# Patient Record
Sex: Female | Born: 2000 | Race: White | Hispanic: No | Marital: Single | Smoking: Current some day smoker
Health system: Southern US, Community
[De-identification: ages and names within clinical notes are randomized; demographics above are authoritative.]

## PROBLEM LIST (undated history)

## (undated) DIAGNOSIS — A599 Trichomoniasis, unspecified: Secondary | ICD-10-CM

## (undated) DIAGNOSIS — G43909 Migraine, unspecified, not intractable, without status migrainosus: Secondary | ICD-10-CM

## (undated) DIAGNOSIS — F329 Major depressive disorder, single episode, unspecified: Secondary | ICD-10-CM

## (undated) DIAGNOSIS — F32A Depression, unspecified: Secondary | ICD-10-CM

## (undated) HISTORY — DX: Trichomoniasis, unspecified: A59.9

## (undated) HISTORY — DX: Major depressive disorder, single episode, unspecified: F32.9

## (undated) HISTORY — PX: TONSILLECTOMY: SUR1361

## (undated) HISTORY — PX: COLONOSCOPY: SHX174

## (undated) HISTORY — DX: Migraine, unspecified, not intractable, without status migrainosus: G43.909

## (undated) HISTORY — DX: Depression, unspecified: F32.A

---
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
## 2020-01-05 ENCOUNTER — Ambulatory Visit (INDEPENDENT_AMBULATORY_CARE_PROVIDER_SITE_OTHER): Payer: Medicaid Other | Admitting: Family

## 2020-01-05 ENCOUNTER — Encounter: Payer: Self-pay | Admitting: Family

## 2020-01-05 ENCOUNTER — Other Ambulatory Visit: Payer: Self-pay

## 2020-01-05 VITALS — BP 110/60 | HR 72 | Temp 98.4°F | Resp 16

## 2020-01-05 DIAGNOSIS — R112 Nausea with vomiting, unspecified: Secondary | ICD-10-CM

## 2020-01-05 DIAGNOSIS — J309 Allergic rhinitis, unspecified: Secondary | ICD-10-CM

## 2020-01-05 DIAGNOSIS — R109 Unspecified abdominal pain: Secondary | ICD-10-CM

## 2020-01-05 DIAGNOSIS — K9049 Malabsorption due to intolerance, not elsewhere classified: Secondary | ICD-10-CM

## 2020-01-05 NOTE — Patient Instructions (Signed)
Allergic rhinitis (grass and dust mite) Continue avoidance measures May use Zyrtec 10 mg once a day as needed for runny nose or itching  Reducing Pollen Exposure The American Academy of Allergy, Asthma and Immunology suggests the following steps to reduce your exposure to pollen during allergy seasons. 1. Do not hang sheets or clothing out to dry; pollen may collect on these items. 2. Do not mow lawns or spend time around freshly cut grass; mowing stirs up pollen. 3. Keep windows closed at night.  Keep car windows closed while driving. 4. Minimize morning activities outdoors, a time when pollen counts are usually at their highest. 5. Stay indoors as much as possible when pollen counts or humidity is high and on windy days when pollen tends to remain in the air longer. 6. Use air conditioning when possible.  Many air conditioners have filters that trap the pollen spores. 7. Use a HEPA room air filter to remove pollen form the indoor air you breathe.  Control of Dust Mite Allergen Dust mites play a major role in allergic asthma and rhinitis. They occur in environments with high humidity wherever human skin is found. Dust mites absorb humidity from the atmosphere (ie, they do not drink) and feed on organic matter (including shed human and animal skin). Dust mites are a microscopic type of insect that you cannot see with the naked eye. High levels of dust mites have been detected from mattresses, pillows, carpets, upholstered furniture, bed covers, clothes, soft toys and any woven material. The principal allergen of the dust mite is found in its feces. A gram of dust may contain 1,000 mites and 250,000 fecal particles. Mite antigen is easily measured in the air during house cleaning activities. Dust mites do not bite and do not cause harm to humans, other than by triggering allergies/asthma.  Ways to decrease your exposure to dust mites in your home:  1. Encase mattresses, box springs and pillows with  a mite-impermeable barrier or cover  2. Wash sheets, blankets and drapes weekly in hot water (130 F) with detergent and dry them in a dryer on the hot setting.  3. Have the room cleaned frequently with a vacuum cleaner and a damp dust-mop. For carpeting or rugs, vacuuming with a vacuum cleaner equipped with a high-efficiency particulate air (HEPA) filter. The dust mite allergic individual should not be in a room which is being cleaned and should wait 1 hour after cleaning before going into the room.  4. Do not sleep on upholstered furniture (eg, couches).  5. If possible removing carpeting, upholstered furniture and drapery from the home is ideal. Horizontal blinds should be eliminated in the rooms where the person spends the most time (bedroom, study, television room). Washable vinyl, roller-type shades are optimal.  6. Remove all non-washable stuffed toys from the bedroom. Wash stuffed toys weekly like sheets and blankets above.  7. Reduce indoor humidity to less than 50%. Inexpensive humidity monitors can be purchased at most hardware stores. Do not use a humidifier as can make the problem worse and are not recommended.  Abdominal pain We will check a C4 and tryptase level. We will call you with results.  Food intolerance Continue to avoid foods that bother you.  Your skin testing to all foods today were negative.  Please let us know if this treatment plan is not working well for you Schedule follow up appointment as needed

## 2020-01-05 NOTE — Progress Notes (Signed)
[address] [place] [address] Dept: [phone]  FOLLOW UP NOTE  Patient ID: Patty Gomez, female    DOB: 05-Nov-2000  Age: 19 y.o. MRN: [id number] Date of Office Visit: 01/05/2020  Assessment  Chief Complaint: Allergy Testing  HPI Patty Gomez is a 19 year old female who presents today for skin testing to foods and environmental inhalants.  She was last seen on December 31, 2019 by Dr. Dellis Anes for possible food intolerance and chronic rhinitis.  She reports since her last office visit she has thrown up 2 times. When she does vomit it appears to be brown/green in color or clear to white in color. Her symptoms typically occur in the morning where she will have stomach cramps and nausea. One episode occurred 2 hours after eating plain toast and the other episode occurred without eating any food. She still feels like milk triggers her symptoms. She reports that both of these occurrences took place in the morning. She denies any concomminant rashes, difficulty breathing, difficulty swallowing, or shortness of breath.  She also denies any family history of angioedema or swelling.  She reports constipation, but has 3 bowel movements a day and denies diarrhea.  She denies any blood in her stools. She has a follow up appointment with GI in August or September.  She reports that her allergies are controlled with Zyrtec. She reports that her symptoms are worse in the spring and the summer. She reports rhinorrhea and itchy watery eyes.  Drug Allergies:  No Known Allergies  Review of Systems: Review of Systems  Constitutional: Negative for chills and fever.  HENT: Negative for nosebleeds and sore throat.   Eyes: Negative for blurred vision.  Respiratory: Negative for cough, shortness of breath and wheezing.   Cardiovascular: Negative for chest pain and palpitations.  Gastrointestinal: Positive for abdominal pain, constipation and vomiting. Negative for blood in stool and  diarrhea.  Genitourinary: Negative for dysuria.  Skin: Negative for itching and rash.    Physical Exam: BP 110/60 (BP Location: Right Arm, Patient Position: Sitting, Cuff Size: Normal)   Pulse 72   Temp 98.4 F (36.9 C) (Temporal)   Resp 16   SpO2 98%    Physical Exam Constitutional:      Appearance: Normal appearance.  HENT:     Head: Normocephalic and atraumatic.     Comments: Pharynx normal. Eyes normal. Nose normal. Ears normal    Right Ear: Tympanic membrane, ear canal and external ear normal.     Left Ear: Tympanic membrane, ear canal and external ear normal.     Nose: Nose normal.     Mouth/Throat:     Mouth: Mucous membranes are moist.     Pharynx: Oropharynx is clear.  Eyes:     Conjunctiva/sclera: Conjunctivae normal.  Cardiovascular:     Rate and Rhythm: Regular rhythm.     Heart sounds: Normal heart sounds.  Pulmonary:     Effort: Pulmonary effort is normal.     Breath sounds: Normal breath sounds.     Comments: Lungs clear to auscultation Skin:    General: Skin is warm.  Neurological:     Mental Status: She is alert and oriented to person, place, and time.  Psychiatric:        Mood and Affect: Mood normal.        Behavior: Behavior normal.        Thought Content: Thought content normal.        Judgment: Judgment normal.  Diagnostics:  Percutaneous skin testing today was positive today to dust mite and grass. All foods were negative despite a positive histaimine.  Assessment and Plan: 1. Food intolerance   2. Abdominal pain, unspecified abdominal location   3. Nausea and vomiting, intractability of vomiting not specified, unspecified vomiting type     No orders of the defined types were placed in this encounter.   Patient Instructions  Allergic rhinitis (grass and dust mite) Continue avoidance measures May use Zyrtec 10 mg once a day as needed for runny nose or itching  Reducing Pollen Exposure The American Academy of Allergy, Asthma and  Immunology suggests the following steps to reduce your exposure to pollen during allergy seasons. 1. Do not hang sheets or clothing out to dry; pollen may collect on these items. 2. Do not mow lawns or spend time around freshly cut grass; mowing stirs up pollen. 3. Keep windows closed at night.  Keep car windows closed while driving. 4. Minimize morning activities outdoors, a time when pollen counts are usually at their highest. 5. Stay indoors as much as possible when pollen counts or humidity is high and on windy days when pollen tends to remain in the air longer. 6. Use air conditioning when possible.  Many air conditioners have filters that trap the pollen spores. 7. Use a HEPA room air filter to remove pollen form the indoor air you breathe.  Control of Dust Mite Allergen Dust mites play a major role in allergic asthma and rhinitis. They occur in environments with high humidity wherever human skin is found. Dust mites absorb humidity from the atmosphere (ie, they do not drink) and feed on organic matter (including shed human and animal skin). Dust mites are a microscopic type of insect that you cannot see with the naked eye. High levels of dust mites have been detected from mattresses, pillows, carpets, upholstered furniture, bed covers, clothes, soft toys and any woven material. The principal allergen of the dust mite is found in its feces. A gram of dust may contain 1,000 mites and 250,000 fecal particles. Mite antigen is easily measured in the air during house cleaning activities. Dust mites do not bite and do not cause harm to humans, other than by triggering allergies/asthma.  Ways to decrease your exposure to dust mites in your home:  1. Encase mattresses, box springs and pillows with a mite-impermeable barrier or cover  2. Wash sheets, blankets and drapes weekly in hot water (130 F) with detergent and dry them in a dryer on the hot setting.  3. Have the room cleaned frequently with a  vacuum cleaner and a damp dust-mop. For carpeting or rugs, vacuuming with a vacuum cleaner equipped with a high-efficiency particulate air (HEPA) filter. The dust mite allergic individual should not be in a room which is being cleaned and should wait 1 hour after cleaning before going into the room.  4. Do not sleep on upholstered furniture (eg, couches).  5. If possible removing carpeting, upholstered furniture and drapery from the home is ideal. Horizontal blinds should be eliminated in the rooms where the person spends the most time (bedroom, study, television room). Washable vinyl, roller-type shades are optimal.  6. Remove all non-washable stuffed toys from the bedroom. Wash stuffed toys weekly like sheets and blankets above.  7. Reduce indoor humidity to less than 50%. Inexpensive humidity monitors can be purchased at most hardware stores. Do not use a humidifier as can make the problem worse and are not recommended.  Abdominal pain We will check a C4 and tryptase level. We will call you with results.  Food intolerance Continue to avoid foods that bother you.  Your skin testing to all foods today were negative.  Please let us know if this treatment plan is not working well for you Schedule follow up appointment as needed   Return if symptoms worsen or fail to improve.    Thank you for the opportunity to care for this patient.  Please do not hesitate to contact me with questions.  Nehemiah Settle, FNP [facility]

## 2020-03-03 ENCOUNTER — Other Ambulatory Visit: Payer: Self-pay

## 2020-08-08 ENCOUNTER — Other Ambulatory Visit: Payer: Medicaid Other

## 2020-08-09 IMAGING — NM NM GASTRIC EMPTYING
6 series · 6 of 6 positions shown · non-contrast
Comparison: None.

CLINICAL DATA: Nausea and vomiting

EXAM:
NUCLEAR MEDICINE GASTRIC EMPTYING SCAN
TECHNIQUE: After oral ingestion of radiolabeled meal, sequential abdominal
images were obtained for 120 minutes. Residual percentage of
activity remaining within the stomach was calculated at 60 and 120
minutes.
RADIOPHARMACEUTICALS:  1.85 mCi Yc-GGm sulfur colloid in
standardized meal including egg

[Series 1: 0 min · 4.14mm/px · 1 of 1 slices shown (1 of 2)]
[im 1/1]
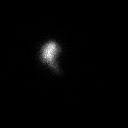

[Series 1: 0 min · 4.14mm/px · 1 of 1 slices shown (2 of 2)]
[im 1/1]
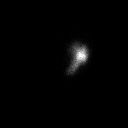

[Series 2: 60 min · 4.14mm/px · 1 of 1 slices shown (1 of 2)]
[im 1/1]
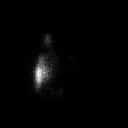

[Series 2: 60 min · 4.14mm/px · 1 of 1 slices shown (2 of 2)]
[im 1/1]
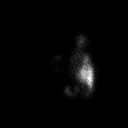

[Series 3: 120 min · 4.14mm/px · 1 of 1 slices shown (1 of 2)]
[im 1/1]
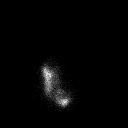

[Series 3: 120 min · 4.14mm/px · 1 of 1 slices shown (2 of 2)]
[im 1/1]
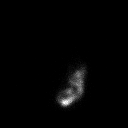

[6 of 6 positions shown; findings below may reference images not displayed]

FINDINGS: Expected location of the stomach in the left upper quadrant.
Ingested meal empties the stomach gradually over the course of the
study with 21.4% retention at 60 min and 3.4% retention at 120 min
(normal retention less than 30% at a 120 min).
IMPRESSION: Normal gastric emptying study.

## 2021-03-07 ENCOUNTER — Ambulatory Visit: Payer: Medicaid Other | Admitting: Adult Health

## 2021-03-20 ENCOUNTER — Other Ambulatory Visit (HOSPITAL_COMMUNITY)
Admission: RE | Admit: 2021-03-20 | Discharge: 2021-03-20 | Disposition: A | Discharge status: Home / Self Care | Payer: Medicaid Other | Source: Ambulatory Visit | Attending: Adult Health | Admitting: Adult Health

## 2021-03-20 ENCOUNTER — Encounter: Payer: Self-pay | Admitting: Adult Health

## 2021-03-20 ENCOUNTER — Ambulatory Visit (INDEPENDENT_AMBULATORY_CARE_PROVIDER_SITE_OTHER): Payer: Medicaid Other | Admitting: Adult Health

## 2021-03-20 ENCOUNTER — Other Ambulatory Visit: Payer: Self-pay

## 2021-03-20 VITALS — BP 124/81 | HR 98 | Ht 69.5 in | Wt 178.0 lb

## 2021-03-20 DIAGNOSIS — Z113 Encounter for screening for infections with a predominantly sexual mode of transmission: Secondary | ICD-10-CM | POA: Insufficient documentation

## 2021-03-20 DIAGNOSIS — N949 Unspecified condition associated with female genital organs and menstrual cycle: Secondary | ICD-10-CM | POA: Insufficient documentation

## 2021-03-20 DIAGNOSIS — N898 Other specified noninflammatory disorders of vagina: Secondary | ICD-10-CM | POA: Insufficient documentation

## 2021-03-20 DIAGNOSIS — Z975 Presence of (intrauterine) contraceptive device: Secondary | ICD-10-CM | POA: Diagnosis not present

## 2021-03-20 DIAGNOSIS — N9489 Other specified conditions associated with female genital organs and menstrual cycle: Secondary | ICD-10-CM

## 2021-03-20 DIAGNOSIS — R300 Dysuria: Secondary | ICD-10-CM | POA: Insufficient documentation

## 2021-03-20 HISTORY — DX: Encounter for screening for infections with a predominantly sexual mode of transmission: Z11.3

## 2021-03-20 HISTORY — DX: Unspecified condition associated with female genital organs and menstrual cycle: N94.9

## 2021-03-20 HISTORY — DX: Other specified noninflammatory disorders of vagina: N89.8

## 2021-03-20 HISTORY — DX: Other specified conditions associated with female genital organs and menstrual cycle: N94.89

## 2021-03-20 NOTE — Progress Notes (Signed)
  Subjective:     Patient ID: Patty Gomez, female   DOB: [birth year], 20 y.o.   MRN: [id number]  HPI Patty Gomez is a 20 year old white female,single, G0P0, in complaining of vaginal discharge and burning, it looks pink at times. PCP is [facility].   Review of Systems +Has vaginal discharge +Has vaginal burning  +burns when pees, but not UTI when checked No sex in 2 months Stomach hurts at times Had bump right labia but it is gone now Reviewed past medical,surgical, social and family history. Reviewed medications and allergies.     Objective:   Physical Exam BP 124/81 (BP Location: Left Arm, Patient Position: Sitting, Cuff Size: Normal)   Pulse 98   Ht 5' 9.5" (1.765 m)   Wt 178 lb (80.7 kg)   BMI 25.91 kg/m  Skin warm and dry.Pelvic: external genitalia is normal in appearance no lesions, vagina:tan/pinkish discharge without odor,urethra has no lesions or masses noted, cervix is non tender with +IUD strings at os, uterus: normal size, shape and contour, non tender, no masses felt, adnexa: no masses or tenderness noted. Bladder is non tender and no masses felt. CV swab obtained.  Upstream - 03/20/21 1410       Pregnancy Intention Screening   Does the patient want to become pregnant in the next year? No    Does the patient's partner want to become pregnant in the next year? No    Would the patient like to discuss contraceptive options today? No      Contraception Wrap Up   Current Method IUD or IUS    End Method IUD or IUS    Contraception Counseling Provided No            Examination chaperoned by Marchelle Folks LPN.     Assessment:     1. Vaginal discharge CV swab sent  - Cervicovaginal ancillary only( [place])  2. Vaginal burning  - Cervicovaginal ancillary only( [place])  3. IUD (intrauterine device) in place   4. Screening examination for STD (sexually transmitted disease) CV swab sent for GC/CHL,trich and BV,yeast - Cervicovaginal ancillary only( [facility])  5. Burning with urination Has not had UTI, when checked      Plan:     Pap and physical 10/29/21 with me

## 2021-03-22 ENCOUNTER — Other Ambulatory Visit: Payer: Self-pay | Admitting: Adult Health

## 2021-03-22 LAB — CERVICOVAGINAL ANCILLARY ONLY
Bacterial Vaginitis (gardnerella): NEGATIVE
Candida Glabrata: NEGATIVE
Candida Vaginitis: POSITIVE — AB
Chlamydia: NEGATIVE
Comment: NEGATIVE
Comment: NEGATIVE
Comment: NEGATIVE
Comment: NEGATIVE
Comment: NEGATIVE
Comment: NORMAL
Neisseria Gonorrhea: NEGATIVE
Trichomonas: NEGATIVE

## 2021-03-22 MED ORDER — FLUCONAZOLE 150 MG PO TABS: ORAL_TABLET | ORAL | 1 refills | Status: DC

## 2021-03-22 NOTE — Progress Notes (Signed)
+  yeast on vaginal swab will rx diflucan  

## 2021-05-13 DIAGNOSIS — Z114 Encounter for screening for human immunodeficiency virus [HIV]: Secondary | ICD-10-CM | POA: Diagnosis not present

## 2021-05-13 DIAGNOSIS — F909 Attention-deficit hyperactivity disorder, unspecified type: Secondary | ICD-10-CM | POA: Diagnosis not present

## 2021-05-13 DIAGNOSIS — N898 Other specified noninflammatory disorders of vagina: Secondary | ICD-10-CM | POA: Diagnosis not present

## 2021-05-13 DIAGNOSIS — Z7251 High risk heterosexual behavior: Secondary | ICD-10-CM | POA: Diagnosis not present

## 2021-05-13 DIAGNOSIS — Z20828 Contact with and (suspected) exposure to other viral communicable diseases: Secondary | ICD-10-CM | POA: Diagnosis not present

## 2021-05-23 ENCOUNTER — Encounter: Payer: Self-pay | Admitting: Emergency Medicine

## 2021-05-23 ENCOUNTER — Other Ambulatory Visit: Payer: Self-pay

## 2021-05-23 ENCOUNTER — Ambulatory Visit
Admission: EM | Admit: 2021-05-23 | Discharge: 2021-05-23 | Disposition: A | Discharge status: Home / Self Care | Payer: Medicaid Other | Attending: Family Medicine | Admitting: Family Medicine

## 2021-05-23 DIAGNOSIS — M79604 Pain in right leg: Secondary | ICD-10-CM

## 2021-05-23 DIAGNOSIS — R059 Cough, unspecified: Secondary | ICD-10-CM | POA: Diagnosis not present

## 2021-05-23 DIAGNOSIS — J069 Acute upper respiratory infection, unspecified: Secondary | ICD-10-CM

## 2021-05-23 MED ORDER — NAPROXEN 500 MG PO TABS: 500.0000 mg | ORAL_TABLET | Freq: Two times a day (BID) | ORAL | 0 refills | Status: DC | PRN

## 2021-05-23 MED ORDER — PROMETHAZINE-DM 6.25-15 MG/5ML PO SYRP: 5.0000 mL | ORAL_SOLUTION | Freq: Four times a day (QID) | ORAL | 0 refills | Status: DC | PRN

## 2021-05-23 MED ORDER — CYCLOBENZAPRINE HCL 5 MG PO TABS: 5.0000 mg | ORAL_TABLET | Freq: Three times a day (TID) | ORAL | 0 refills | Status: DC | PRN

## 2021-05-23 NOTE — ED Provider Notes (Signed)
[facility]    CSN: [id number] Arrival date & time: 05/23/21  1138      History   Chief Complaint Chief Complaint  Patient presents with   Leg Pain    HPI Patty Gomez is a 20 y.o. female.   Presenting today with 2-day history of fever, sweats, chills, weakness, cough, congestion, sore throat.  Denies chest pain, shortness of breath, abdominal pain, nausea vomiting or diarrhea.  States her fever has broken and she is feeling a whole lot better today but still needs a COVID and flu test.  Taking Tylenol with mild temporary relief of symptoms has not had anything today thus far.  She is also complaining of about 5 days of right posterior buttock pain going down to her right lateral leg above the knee.  She denies injury to the area, numbness, tingling, weakness, swelling, redness.  So far not tried anything over-the-counter for this.   Past Medical History:  Diagnosis Date   Depression    Migraines     Patient Active Problem List   Diagnosis Date Noted   IUD (intrauterine device) in place 03/20/2021   Vaginal burning 03/20/2021   Vaginal discharge 03/20/2021   Burning with urination 03/20/2021   Screening examination for STD (sexually transmitted disease) 03/20/2021   Abdominal pain    GERD (gastroesophageal reflux disease) 04/06/2019   Nausea without vomiting 03/03/2019   IBS (irritable bowel syndrome) 03/03/2019   Encounter for IUD insertion 01/27/2018   Anxiety state 08/22/2015   Tension headache 06/21/2015   Migraine without aura and without status migrainosus, not intractable 06/21/2015   Acute bronchitis 01/20/2013   Unspecified sinusitis (chronic) 01/20/2013    Past Surgical History:  Procedure Laterality Date   BIOPSY  05/07/2019   Procedure: BIOPSY;  Surgeon: West Bali, MD;  Location: [facility];  Service: Endoscopy;;   COLONOSCOPY     ESOPHAGOGASTRODUODENOSCOPY N/A 05/07/2019   Procedure: ESOPHAGOGASTRODUODENOSCOPY (EGD);  Surgeon:  West Bali, MD;  Location: [facility];  Service: Endoscopy;  Laterality: N/A;  3:00pm   TONSILLECTOMY      OB History     Gravida  0   Para  0   Term  0   Preterm  0   AB  0   Living  0      SAB  0   IAB  0   Ectopic  0   Multiple  0   Live Births  0            Home Medications    Prior to Admission medications   Medication Sig Start Date End Date Taking? Authorizing Provider  cyclobenzaprine (FLEXERIL) 5 MG tablet Take 1 tablet (5 mg total) by mouth 3 (three) times daily as needed for muscle spasms. Do not drink alcohol or drive while taking this medication.  May cause drowsiness. 05/23/21  Yes Particia Nearing, PA-C  naproxen (NAPROSYN) 500 MG tablet Take 1 tablet (500 mg total) by mouth 2 (two) times daily as needed. 05/23/21  Yes Particia Nearing, PA-C  promethazine-dextromethorphan (PROMETHAZINE-DM) 6.25-15 MG/5ML syrup Take 5 mLs by mouth 4 (four) times daily as needed. 05/23/21  Yes Particia Nearing, PA-C  amitriptyline (ELAVIL) 10 MG tablet Take 10 mg by mouth at bedtime. 09/16/19   [provider]  dicyclomine (BENTYL) 20 MG tablet Take 20 mg by mouth 3 (three) times daily. 10/11/19   [provider]  fluconazole (DIFLUCAN) 150 MG tablet Take 1 now  and 1 in 3 days 03/22/21   Cyril Mourning A, NP  ibuprofen (ADVIL) 200 MG tablet Take 200 mg by mouth daily as needed for mild pain, moderate pain or cramping.     [provider]  levonorgestrel (LILETTA) 19.5 MCG/DAY IUD IUD 1 each by Intrauterine route once.    [provider]  lisdexamfetamine (VYVANSE) 20 MG capsule Take 20 mg by mouth daily.    [provider]  loratadine (CLARITIN) 10 MG tablet Take 10 mg by mouth daily. Patient not taking: Reported on 03/20/2021 12/07/19   [provider]  omeprazole (PRILOSEC) 40 MG capsule Take 40 mg by mouth daily. Patient not taking: Reported on 03/20/2021 07/22/19   [provider]   ondansetron (ZOFRAN) 4 MG tablet Take 1 tablet (4 mg total) by mouth every 6 (six) hours. Patient taking differently: Take 4 mg by mouth every 8 (eight) hours as needed for nausea or vomiting. 07/15/19   Wynetta Fines, MD  pantoprazole (PROTONIX) 40 MG tablet Take 1 tablet (40 mg total) by mouth 2 (two) times daily before a meal. 04/09/19   Gelene Mink, NP  promethazine (PHENERGAN) 25 MG tablet Take 1 tablet (25 mg total) by mouth every 6 (six) hours as needed for nausea or vomiting. Patient not taking: Reported on 04/09/2019 04/07/19 10/25/19  Gelene Mink, NP    Family History Family History  Problem Relation Age of Onset   Bipolar disorder Mother    Cancer Mother    Hypertension Paternal Grandmother    Diabetes Paternal Grandmother    Diabetes Other    Cancer Other    Colon cancer Neg Hx    Colon polyps Neg Hx     Social History Social History   Tobacco Use   Smoking status: Never   Smokeless tobacco: Never  Vaping Use   Vaping Use: Former  Substance Use Topics   Alcohol use: No   Drug use: No     Allergies   Patient has no known allergies.   Review of Systems Review of Systems Per HPI  Physical Exam Triage Vital Signs ED Triage Vitals  Enc Vitals Group     BP 05/23/21 1259 124/66     Pulse Rate 05/23/21 1259 99     Resp 05/23/21 1259 18     Temp 05/23/21 1259 98.3 F (36.8 C)     Temp Source 05/23/21 1259 Oral     SpO2 05/23/21 1259 98 %     Weight 05/23/21 1243 198 lb (89.8 kg)     Height 05/23/21 1243 5' 9.5" (1.765 m)     Head Circumference --      Peak Flow --      Pain Score 05/23/21 1243 6     Pain Loc --      Pain Edu? --      Excl. in GC? --    No data found.  Updated Vital Signs BP 124/66 (BP Location: Right Arm)   Pulse 99   Temp 98.3 F (36.8 C) (Oral)   Resp 18   Ht 5' 9.5" (1.765 m)   Wt 198 lb (89.8 kg)   SpO2 98%   BMI 28.82 kg/m   Visual Acuity Right Eye Distance:   Left Eye Distance:   Bilateral Distance:     Right Eye Near:   Left Eye Near:    Bilateral Near:     Physical Exam Vitals and nursing note reviewed.  Constitutional:  Appearance: Normal appearance. She is not ill-appearing.  HENT:     Head: Atraumatic.     Right Ear: Tympanic membrane normal.     Left Ear: Tympanic membrane normal.     Nose: Rhinorrhea present.     Mouth/Throat:     Mouth: Mucous membranes are moist.     Pharynx: Posterior oropharyngeal erythema present.  Eyes:     Extraocular Movements: Extraocular movements intact.     Conjunctiva/sclera: Conjunctivae normal.  Cardiovascular:     Rate and Rhythm: Normal rate and regular rhythm.     Heart sounds: Normal heart sounds.  Pulmonary:     Effort: Pulmonary effort is normal.     Breath sounds: Normal breath sounds. No wheezing or rales.  Musculoskeletal:        General: Tenderness present. No swelling or deformity. Normal range of motion.     Cervical back: Normal range of motion and neck supple.     Comments: Tender to palpation right posterior buttock and right lateral upper leg.  Normal gait and range of motion, strength  Skin:    General: Skin is warm and dry.     Findings: No bruising or erythema.  Neurological:     Mental Status: She is alert and oriented to person, place, and time.     Sensory: No sensory deficit.     Motor: No weakness.     Gait: Gait normal.  Psychiatric:        Mood and Affect: Mood normal.        Thought Content: Thought content normal.        Judgment: Judgment normal.   UC Treatments / Results  Labs (all labs ordered are listed, but only abnormal results are displayed) Labs Reviewed  COVID-19, FLU A+B NAA    EKG   Radiology No results found.  Procedures Procedures (including critical care time)  Medications Ordered in UC Medications - No data to display  Initial Impression / Assessment and Plan / UC Course  I have reviewed the triage vital signs and the nursing notes.  Pertinent labs & imaging  results that were available during my care of the patient were reviewed by me and considered in my medical decision making (see chart for details).     Right leg pain consistent with muscular strain, will treat with naproxen, Flexeril, heat, stretches, rest.  She does appear to have a viral upper respiratory infection, COVID and flu testing pending, treat with Phenergan DM, over-the-counter cold and congestion medications, supportive home care.  Return for acutely worsening symptoms.  Work note given.  Return for acutely worsening symptoms.  Final Clinical Impressions(s) / UC Diagnoses   Final diagnoses:  Cough, unspecified type  Viral URI with cough  Right leg pain   Discharge Instructions   None    ED Prescriptions     Medication Sig Dispense Auth. Provider   naproxen (NAPROSYN) 500 MG tablet Take 1 tablet (500 mg total) by mouth 2 (two) times daily as needed. 30 tablet Particia Nearing, [place]   cyclobenzaprine (FLEXERIL) 5 MG tablet Take 1 tablet (5 mg total) by mouth 3 (three) times daily as needed for muscle spasms. Do not drink alcohol or drive while taking this medication.  May cause drowsiness. 15 tablet Particia Nearing, [place]   promethazine-dextromethorphan (PROMETHAZINE-DM) 6.25-15 MG/5ML syrup Take 5 mLs by mouth 4 (four) times daily as needed. 100 mL Particia Nearing, [place]      PDMP not reviewed this  encounter.   Particia Nearing, [place] 05/23/21 1343

## 2021-05-23 NOTE — ED Triage Notes (Signed)
Weakness, fever, night sweats x2 days. Covid negative at home. Pt reports feels better but wanted to get checked for covid/flu.  RLE pain/thigh x5 days. Pt denies any known injury. Pt reports increased pain with ambulation.

## 2021-05-24 LAB — COVID-19, FLU A+B NAA
Influenza A, NAA: NOT DETECTED
Influenza B, NAA: NOT DETECTED
SARS-CoV-2, NAA: NOT DETECTED

## 2021-06-04 ENCOUNTER — Other Ambulatory Visit: Payer: Self-pay

## 2021-06-04 ENCOUNTER — Ambulatory Visit (INDEPENDENT_AMBULATORY_CARE_PROVIDER_SITE_OTHER): Payer: Medicaid Other | Admitting: Adult Health

## 2021-06-04 ENCOUNTER — Encounter: Payer: Self-pay | Admitting: Adult Health

## 2021-06-04 ENCOUNTER — Other Ambulatory Visit (HOSPITAL_COMMUNITY)
Admission: RE | Admit: 2021-06-04 | Discharge: 2021-06-04 | Disposition: A | Discharge status: Home / Self Care | Payer: Medicaid Other | Source: Ambulatory Visit | Attending: Adult Health | Admitting: Adult Health

## 2021-06-04 VITALS — BP 125/80 | HR 109 | Ht 69.5 in | Wt 183.5 lb

## 2021-06-04 DIAGNOSIS — Z975 Presence of (intrauterine) contraceptive device: Secondary | ICD-10-CM

## 2021-06-04 DIAGNOSIS — N898 Other specified noninflammatory disorders of vagina: Secondary | ICD-10-CM | POA: Insufficient documentation

## 2021-06-04 DIAGNOSIS — Z113 Encounter for screening for infections with a predominantly sexual mode of transmission: Secondary | ICD-10-CM | POA: Diagnosis not present

## 2021-06-04 DIAGNOSIS — A599 Trichomoniasis, unspecified: Secondary | ICD-10-CM | POA: Insufficient documentation

## 2021-06-04 HISTORY — DX: Other specified noninflammatory disorders of vagina: N89.8

## 2021-06-04 LAB — POCT WET PREP (WET MOUNT)
Clue Cells Wet Prep Whiff POC: POSITIVE
WBC, Wet Prep HPF POC: POSITIVE

## 2021-06-04 MED ORDER — METRONIDAZOLE 500 MG PO TABS: 500.0000 mg | ORAL_TABLET | Freq: Two times a day (BID) | ORAL | 0 refills | Status: DC

## 2021-06-04 NOTE — Progress Notes (Signed)
°  Subjective:     Patient ID: Patty Gomez, female   DOB: 10-28-2000, 20 y.o.   MRN: [id number]  HPI Patty Gomez is a 20 year old white female,single, G0P0, in for STD screening, feels like trich still there, did not finish meds, she got sick ?flu. PCP is [facility].  Review of Systems +vaginal discharge and feels irritated Has not had sex lately Reviewed past medical,surgical, social and family history. Reviewed medications and allergies.     Objective:   Physical Exam BP 125/80 (BP Location: Left Arm, Patient Position: Sitting, Cuff Size: Normal)    Pulse (!) 109    Ht 5' 9.5" (1.765 m)    Wt 183 lb 8 oz (83.2 kg)    BMI 26.71 kg/m     Skin warm and dry.Pelvic: external genitalia is normal in appearance no lesions, vagina: white,frothy discharge with odor,urethra has no lesions or masses noted, cervix +IUD strings at os, CV swab obtained, uterus: normal size, shape and contour, non tender, no masses felt, adnexa: no masses or tenderness noted. Bladder is non tender and no masses felt. Wet prep: + for trich and +WBCs. Fall risk is low    Upstream - 06/04/21 1008       Pregnancy Intention Screening   Does the patient want to become pregnant in the next year? No    Does the patient's partner want to become pregnant in the next year? No    Would the patient like to discuss contraceptive options today? No      Contraception Wrap Up   Current Method IUD or IUS    End Method IUD or IUS    Contraception Counseling Provided No            Examination chaperoned by Malachy Mood LPN  Assessment:     1. Vaginal discharge  2. Trichomoniasis No sex or alcohol Meds ordered this encounter  Medications   metroNIDAZOLE (FLAGYL) 500 MG tablet    Sig: Take 1 tablet (500 mg total) by mouth 2 (two) times daily.    Dispense:  14 tablet    Refill:  0    Order Specific Question:   Supervising Provider    Answer:   Despina Hidden, LUTHER H [[id number]]     3. Screening examination for STD (sexually  transmitted disease) CV swab sent for GC/CHL.trich and BV, yeast  She declines HIV,RPR  4. IUD (intrauterine device) in place  5. Vaginal irritation     Plan:     Follow up in 2 weeks for proof of treatment

## 2021-06-05 ENCOUNTER — Other Ambulatory Visit: Payer: Self-pay | Admitting: Adult Health

## 2021-06-05 LAB — CERVICOVAGINAL ANCILLARY ONLY
Bacterial Vaginitis (gardnerella): POSITIVE — AB
Candida Glabrata: POSITIVE — AB
Candida Vaginitis: NEGATIVE
Chlamydia: NEGATIVE
Comment: NEGATIVE
Comment: NEGATIVE
Comment: NEGATIVE
Comment: NEGATIVE
Comment: NEGATIVE
Comment: NORMAL
Neisseria Gonorrhea: NEGATIVE
Trichomonas: POSITIVE — AB

## 2021-06-05 MED ORDER — FLUCONAZOLE 150 MG PO TABS: ORAL_TABLET | ORAL | 1 refills | Status: DC

## 2021-06-05 NOTE — Progress Notes (Signed)
+  yeast on vaginal swab, will rx diflucan, already treated for trich and BV

## 2021-06-19 ENCOUNTER — Ambulatory Visit: Payer: Self-pay | Admitting: Adult Health

## 2021-09-25 ENCOUNTER — Other Ambulatory Visit (INDEPENDENT_AMBULATORY_CARE_PROVIDER_SITE_OTHER): Payer: Medicaid Other | Admitting: *Deleted

## 2021-09-25 ENCOUNTER — Other Ambulatory Visit (HOSPITAL_COMMUNITY)
Admission: RE | Admit: 2021-09-25 | Discharge: 2021-09-25 | Disposition: A | Discharge status: Home / Self Care | Payer: Medicaid Other | Source: Ambulatory Visit | Attending: Obstetrics & Gynecology | Admitting: Obstetrics & Gynecology

## 2021-09-25 DIAGNOSIS — N898 Other specified noninflammatory disorders of vagina: Secondary | ICD-10-CM | POA: Diagnosis not present

## 2021-09-25 DIAGNOSIS — Z113 Encounter for screening for infections with a predominantly sexual mode of transmission: Secondary | ICD-10-CM | POA: Diagnosis present

## 2021-09-25 NOTE — Progress Notes (Signed)
? ?  NURSE VISIT- VAGINITIS/STD ? ?SUBJECTIVE:  ?Patty Gomez is a 21 y.o. [facility] patient female here for a vaginal swab for vaginitis screening, STD screen.  She reports the following symptoms:  vaginal odor and discharge  for 1-3 weeks. ?Denies abnormal vaginal bleeding, significant pelvic pain, fever, or UTI symptoms. Pt is wanting to get pregnant so she will schedule an appt for IUD removal.  ? ?OBJECTIVE:  ?There were no vitals taken for this visit.  ?Appears well, in no apparent distress ? ?ASSESSMENT: ?Vaginal swab for vaginitis screening & STD screening ? ?PLAN: ?Self-collected vaginal probe for Gonorrhea, Chlamydia, Trichomonas, Bacterial Vaginosis, Yeast sent to lab ?Treatment: to be determined once results are received ?Follow-up as needed if symptoms persist/worsen, or new symptoms develop ? ?Levy Pupa  ?09/25/2021 ?11:01 AM ? ?

## 2021-09-26 ENCOUNTER — Other Ambulatory Visit: Payer: Self-pay | Admitting: Women's Health

## 2021-09-26 LAB — CERVICOVAGINAL ANCILLARY ONLY
Bacterial Vaginitis (gardnerella): POSITIVE — AB
Candida Glabrata: NEGATIVE
Candida Vaginitis: NEGATIVE
Chlamydia: NEGATIVE
Comment: NEGATIVE
Comment: NEGATIVE
Comment: NEGATIVE
Comment: NEGATIVE
Comment: NEGATIVE
Comment: NORMAL
Neisseria Gonorrhea: NEGATIVE
Trichomonas: NEGATIVE

## 2021-09-26 MED ORDER — METRONIDAZOLE 500 MG PO TABS: 500.0000 mg | ORAL_TABLET | Freq: Two times a day (BID) | ORAL | 0 refills | Status: DC

## 2021-09-28 ENCOUNTER — Telehealth: Payer: Self-pay | Admitting: *Deleted

## 2021-09-28 NOTE — Telephone Encounter (Signed)
Pt aware swab was + for BV. Advised to take med as directed and don't have sex or drink alcohol while on med. Pt voiced understanding. JSY ?

## 2021-09-28 NOTE — Telephone Encounter (Signed)
-----   Message from Roma Schanz, [place] sent at 09/28/2021  8:47 AM EDT ----- ?Hasn't read [employer]. Please call and read it to her/have her read it. Thanks  ?

## 2021-10-10 DIAGNOSIS — F988 Other specified behavioral and emotional disorders with onset usually occurring in childhood and adolescence: Secondary | ICD-10-CM | POA: Diagnosis not present

## 2021-10-10 DIAGNOSIS — Z79899 Other long term (current) drug therapy: Secondary | ICD-10-CM | POA: Diagnosis not present

## 2021-10-17 ENCOUNTER — Ambulatory Visit: Payer: Medicaid Other | Admitting: Obstetrics & Gynecology

## 2021-10-23 ENCOUNTER — Encounter: Payer: Self-pay | Admitting: Adult Health

## 2021-10-29 ENCOUNTER — Other Ambulatory Visit: Payer: Medicaid Other | Admitting: Adult Health

## 2021-10-31 DIAGNOSIS — K648 Other hemorrhoids: Secondary | ICD-10-CM | POA: Diagnosis not present

## 2021-10-31 DIAGNOSIS — R1115 Cyclical vomiting syndrome unrelated to migraine: Secondary | ICD-10-CM | POA: Diagnosis not present

## 2022-01-09 ENCOUNTER — Other Ambulatory Visit (HOSPITAL_COMMUNITY)
Admission: RE | Admit: 2022-01-09 | Discharge: 2022-01-09 | Disposition: A | Discharge status: Home / Self Care | Payer: Medicaid Other | Source: Ambulatory Visit | Attending: Obstetrics & Gynecology | Admitting: Obstetrics & Gynecology

## 2022-01-09 ENCOUNTER — Encounter: Payer: Self-pay | Admitting: Obstetrics & Gynecology

## 2022-01-09 ENCOUNTER — Ambulatory Visit (INDEPENDENT_AMBULATORY_CARE_PROVIDER_SITE_OTHER): Payer: Medicaid Other | Admitting: Obstetrics & Gynecology

## 2022-01-09 VITALS — BP 116/59 | HR 96 | Ht 69.2 in | Wt 180.0 lb

## 2022-01-09 DIAGNOSIS — Z113 Encounter for screening for infections with a predominantly sexual mode of transmission: Secondary | ICD-10-CM | POA: Diagnosis not present

## 2022-01-09 DIAGNOSIS — N898 Other specified noninflammatory disorders of vagina: Secondary | ICD-10-CM

## 2022-01-09 DIAGNOSIS — N3010 Interstitial cystitis (chronic) without hematuria: Secondary | ICD-10-CM

## 2022-01-09 DIAGNOSIS — Z124 Encounter for screening for malignant neoplasm of cervix: Secondary | ICD-10-CM | POA: Diagnosis not present

## 2022-01-09 NOTE — Progress Notes (Signed)
   GYN VISIT Patient name: Patty Gomez MRN [id number]  Date of birth: 2001/01/22 Chief Complaint:   irregular bleeding (For 1 month. Pain with sex)  History of Present Illness:   Patty Gomez is a 21 y.o. G0 female being seen today for the following concerns:  -Notes vaginal odor and irregular bleeding and dyspareunia for the past 4 mos.  Most days has a brown discharge.  No period. Same partner x 23yrs  Notes urinary discomfort mostly right before she voids.  Feels like this has been going on for a few years.  Prior UT work up completed and always negative.  Mirena placed August 2019- for quite some time did not have a menses, now with this discharge/bleeding  No LMP recorded. (Menstrual status: IUD).      No data to display           Review of Systems:   Pertinent items are noted in HPI Denies fever/chills, dizziness, headaches, visual disturbances, fatigue, shortness of breath, chest pain, abdominal pain, vomiting. Pertinent History Reviewed:  Reviewed past medical,surgical, social, obstetrical and family history.  Reviewed problem list, medications and allergies. Physical Assessment:   Vitals:   01/09/22 1429  BP: (!) 116/59  Pulse: 96  Weight: 180 lb (81.6 kg)  Height: 5' 9.2" (1.758 m)  Body mass index is 26.43 kg/m.       Physical Examination:   General appearance: alert, well appearing, and in no distress  Psych: mood appropriate, normal affect  Skin: warm & dry   Cardiovascular: normal heart rate noted  Respiratory: normal respiratory effort, no distress  Abdomen: soft, non-tender, no rebound, no guarding, no reproducible pain  Pelvic: VULVA: normal appearing vulva with no masses, tenderness or lesions, VAGINA: normal appearing vagina with normal color and discharge, no lesions, CERVIX: normal appearing cervix without discharge or lesions, strings visualized at os Extremities: no edema   Chaperone: Latisha Cresenzo    Assessment & Plan:  1) Vaginal  discharge/AUB Plan to r/o underlying infection Further management pending results  2) IUD in place -device appears in proper location -if infection negative and irregular bleeding/discharge continues may consider low dose OCP  3) Painful bladder syndrome/IC -PUF Questionnaire completed >20 -reviewed findings and discussed diagnosis -upon further questioning pt notes lots of citrus, spicy and caffeine -given handout regarding least/most bothersome food and advised review on her own   Return in about 1 year (around 01/10/2023) for Annual.   Myna Hidalgo, DO Attending Obstetrician & Gynecologist, [facility] [employer], [facility]

## 2022-01-14 LAB — CYTOLOGY - PAP
Adequacy: ABSENT
Chlamydia: NEGATIVE
Comment: NEGATIVE
Comment: NORMAL
Diagnosis: NEGATIVE
Neisseria Gonorrhea: NEGATIVE

## 2022-02-08 ENCOUNTER — Ambulatory Visit: Payer: Medicaid Other | Admitting: Internal Medicine

## 2022-02-08 ENCOUNTER — Encounter: Payer: Self-pay | Admitting: Internal Medicine

## 2022-02-08 VITALS — BP 121/84 | HR 100 | Ht 69.5 in | Wt 182.8 lb

## 2022-02-08 DIAGNOSIS — Z0000 Encounter for general adult medical examination without abnormal findings: Secondary | ICD-10-CM | POA: Diagnosis not present

## 2022-02-08 DIAGNOSIS — N939 Abnormal uterine and vaginal bleeding, unspecified: Secondary | ICD-10-CM | POA: Diagnosis not present

## 2022-02-08 DIAGNOSIS — K625 Hemorrhage of anus and rectum: Secondary | ICD-10-CM | POA: Diagnosis not present

## 2022-02-08 DIAGNOSIS — M79675 Pain in left toe(s): Secondary | ICD-10-CM

## 2022-02-08 DIAGNOSIS — Z23 Encounter for immunization: Secondary | ICD-10-CM

## 2022-02-08 DIAGNOSIS — M7989 Other specified soft tissue disorders: Secondary | ICD-10-CM | POA: Diagnosis not present

## 2022-02-08 DIAGNOSIS — N898 Other specified noninflammatory disorders of vagina: Secondary | ICD-10-CM

## 2022-02-08 DIAGNOSIS — F909 Attention-deficit hyperactivity disorder, unspecified type: Secondary | ICD-10-CM

## 2022-02-08 DIAGNOSIS — Z8719 Personal history of other diseases of the digestive system: Secondary | ICD-10-CM

## 2022-02-08 LAB — POCT URINE PREGNANCY: Preg Test, Ur: NEGATIVE

## 2022-02-08 NOTE — Patient Instructions (Signed)
It was a pleasure to see you today.  Thank you for giving [place] the opportunity to be involved in your care.  Below is a brief recap of your visit and next steps.  We will plan to see you again in 3 months  Summary We are checking basic labs today.  I have referred you to psychiatry to discuss ADHD medications Please follow up with your GI doctor about your rectal bleeding You have follow up scheduled with your OB/GYN 9/14  Next steps We will let you know about lab results next week

## 2022-02-08 NOTE — Progress Notes (Unsigned)
New Patient Office Visit  Subjective    Patient ID: Patty Gomez, female    DOB: [birth year]  Age: 21 y.o. MRN: [id number]  CC:  Chief Complaint  Patient presents with   Establish Care    HPI Patty Gomez presents to establish care.  She is a 21 year old woman with past medical history significant for migraines, GERD, IBS, and ADHD.  She presents today to establish care.  Her acute concerns today are rectal bleeding, which she endorses a recurrent history of.  She reports previously being evaluated by gastroenterology, with whom she is still in contact and states that she has undergone multiple procedures to "untwist" her intestines.  She also reports ongoing abnormal uterine bleeding and vaginal odor.  She is established with [facility] and was seen for this last month.  She is planning to make another appointment for them to further discuss the symptoms.  Lastly, [name] reports recently injuring the second digit of her left foot.  She endorses pain, discoloration, and swelling.  Acute concerns and chronic medical issues discussed today individually addressed in A/P below.  Outpatient Encounter Medications as of 02/08/2022  Medication Sig   amitriptyline (ELAVIL) 50 MG tablet Take 50 mg by mouth at bedtime.   levonorgestrel (LILETTA) 19.5 MCG/DAY IUD IUD 1 each by Intrauterine route once.   [DISCONTINUED] lisdexamfetamine (VYVANSE) 20 MG capsule Take 70 mg by mouth daily.   [DISCONTINUED] Acetaminophen (TYLENOL PO) Take by mouth. (Patient not taking: Reported on 01/09/2022)   [DISCONTINUED] cyclobenzaprine (FLEXERIL) 5 MG tablet Take 1 tablet (5 mg total) by mouth 3 (three) times daily as needed for muscle spasms. Do not drink alcohol or drive while taking this medication.  May cause drowsiness. (Patient not taking: Reported on 01/09/2022)   [DISCONTINUED] ibuprofen (ADVIL) 200 MG tablet Take 400 mg by mouth as needed. (Patient not taking: Reported on 01/09/2022)   [DISCONTINUED]  metroNIDAZOLE (FLAGYL) 500 MG tablet Take 1 tablet (500 mg total) by mouth 2 (two) times daily. (Patient not taking: Reported on 01/09/2022)   [DISCONTINUED] ondansetron (ZOFRAN) 4 MG tablet Take 1 tablet (4 mg total) by mouth every 6 (six) hours. (Patient not taking: Reported on 01/09/2022)   [DISCONTINUED] promethazine (PHENERGAN) 25 MG tablet Take 1 tablet (25 mg total) by mouth every 6 (six) hours as needed for nausea or vomiting. (Patient not taking: Reported on 04/09/2019)   No facility-administered encounter medications on file as of 02/08/2022.    Past Medical History:  Diagnosis Date   Depression    Migraines    Trichimoniasis     Past Surgical History:  Procedure Laterality Date   BIOPSY  05/07/2019   Procedure: BIOPSY;  Surgeon: Danie Binder, MD;  Location: [facility];  Service: Endoscopy;;   COLONOSCOPY     ESOPHAGOGASTRODUODENOSCOPY N/A 05/07/2019   Procedure: ESOPHAGOGASTRODUODENOSCOPY (EGD);  Surgeon: Danie Binder, MD;  Location: [facility];  Service: Endoscopy;  Laterality: N/A;  3:00pm   TONSILLECTOMY      Family History  Problem Relation Age of Onset   Bipolar disorder Mother    Cancer Mother    Hypertension Paternal Grandmother    Diabetes Paternal Grandmother    Diabetes Other    Cancer Other    Colon cancer Neg Hx    Colon polyps Neg Hx     Social History   Socioeconomic History   Marital status: Single    Spouse name: Not on file   Number of children: Not on  file   Years of education: Not on file   Highest education level: Not on file  Occupational History   Not on file  Tobacco Use   Smoking status: Never   Smokeless tobacco: Never  Vaping Use   Vaping Use: Former  Substance and Sexual Activity   Alcohol use: Yes    Comment: occ   Drug use: No   Sexual activity: Not Currently    Birth control/protection: I.U.D.  Other Topics Concern   Not on file  Social History Narrative   ** Merged History Encounter **       Lives with  her grandmother. [facility] in spring 2021.    Social Determinants of Health   Financial Resource Strain: Not on file  Food Insecurity: Not on file  Transportation Needs: Not on file  Physical Activity: Not on file  Stress: Not on file  Social Connections: Not on file  Intimate Partner Violence: Not on file    Review of Systems  Constitutional:  Negative for chills and fever.  HENT:  Negative for sore throat.   Respiratory:  Negative for cough and shortness of breath.   Cardiovascular:  Negative for chest pain, palpitations and leg swelling.  Gastrointestinal:  Positive for blood in stool. Negative for abdominal pain, constipation, diarrhea, nausea and vomiting.  Genitourinary:  Negative for dysuria and hematuria.       Vaginal bleeding, abnormal odor  Musculoskeletal:  Negative for myalgias.       Pain in toe on left foot  Skin:  Negative for itching and rash.  Neurological:  Negative for dizziness and headaches.  Psychiatric/Behavioral:  Negative for depression and suicidal ideas.     Objective    BP 121/84   Pulse 100   Ht 5' 9.5" (1.765 m)   Wt 182 lb 12.8 oz (82.9 kg)   SpO2 98%   BMI 26.61 kg/m   Physical Exam Vitals reviewed.  Constitutional:      General: She is not in acute distress.    Appearance: Normal appearance. She is normal weight. She is not toxic-appearing.  HENT:     Head: Normocephalic and atraumatic.     Nose: Nose normal. No congestion or rhinorrhea.     Mouth/Throat:     Mouth: Mucous membranes are moist.     Pharynx: Oropharynx is clear. No oropharyngeal exudate or posterior oropharyngeal erythema.  Eyes:     General: No scleral icterus.    Extraocular Movements: Extraocular movements intact.     Conjunctiva/sclera: Conjunctivae normal.     Pupils: Pupils are equal, round, and reactive to light.  Cardiovascular:     Rate and Rhythm: Normal rate and regular rhythm.  Pulmonary:     Effort: Pulmonary effort is normal.     Breath sounds: Normal  breath sounds. No wheezing, rhonchi or rales.  Abdominal:     General: Bowel sounds are normal. There is no distension.     Palpations: Abdomen is soft.  Musculoskeletal:        General: Tenderness present.     Comments: There is tenderness palpation, discoloration, mild swelling, and limited ROM at the DIP of the second digit of left foot  Lymphadenopathy:     Cervical: No cervical adenopathy.  Skin:    Capillary Refill: Capillary refill takes less than 2 seconds.  Neurological:     General: No focal deficit present.     Mental Status: She is alert and oriented to person, place, and time.  Cranial Nerves: No cranial nerve deficit.     Motor: No weakness.  Psychiatric:        Mood and Affect: Mood normal.        Behavior: Behavior normal.        Thought Content: Thought content normal.    Assessment & Plan:   Problem List Items Addressed This Visit       Other   Vaginal discharge    Today she endorses abnormal uterine bleeding and vaginal odor.  She was evaluated by [facility] last month for the symptoms.  There has been no improvement.  She has follow-up scheduled for next month.  Urine pregnancy test negative today.  Checking CBC and iron studies as well.      History of intussusception    Today she reports rectal bleeding.  She states states she has experienced the symptoms in the past and has been told that there was an issue with her intestines.  She reports previously undergoing endoscopic procedures to "untwist" her intestines.  On chart review, I am able to see where she has a history of intussusception.  She is planning to contact her gastroenterologist for a follow-up appointment and further evaluation.  Checking CBC and iron studies today.      ADHD - Primary    She endorses a history of ADHD, most recently prescribed Vyvanse.  However, she does not feel as though this medication is working any longer and she wants to stop the medication.  She is interested in  starting new medication, however she would like to speak with a psychiatrist about what is most appropriate for her.  I have placed a referral today.      Relevant Orders   Ambulatory referral to Psychiatry   Pain and swelling of toe of left foot    She reports recently injuring the second digit of left foot.  On exam today there is pain, swelling, discoloration, and limited ROM at the DIP of the second digit of left foot.  I have ordered x-rays today.      Relevant Orders   DG Foot Complete Left   Preventative health care    -Baseline labs ordered today, including one-time screenings for HIV/HCV -Influenza vaccine administered today -Additional HM items up-to-date       Relevant Orders   CBC with Differential (Completed)   CMP14+EGFR (Completed)   Fe+TIBC+Fer (Completed)   HIV antibody (with reflex) (Completed)   HCV Ab w Reflex to Quant PCR (Completed)   POCT urine pregnancy (Completed)   Flu Vaccine QUAD 6+ mos PF IM (Fluarix Quad PF) (Completed)    Return in about 3 months (around 05/11/2022).   Johnette Abraham, MD

## 2022-02-09 LAB — CMP14+EGFR
ALT: 16 IU/L (ref 0–32)
AST: 28 IU/L (ref 0–40)
Albumin/Globulin Ratio: 1.9 (ref 1.2–2.2)
Albumin: 4.8 g/dL (ref 4.0–5.0)
Alkaline Phosphatase: 76 IU/L (ref 44–121)
BUN/Creatinine Ratio: 9 (ref 9–23)
BUN: 8 mg/dL (ref 6–20)
Bilirubin Total: 0.8 mg/dL (ref 0.0–1.2)
CO2: 22 mmol/L (ref 20–29)
Calcium: 9.8 mg/dL (ref 8.7–10.2)
Chloride: 102 mmol/L (ref 96–106)
Creatinine, Ser: 0.88 mg/dL (ref 0.57–1.00)
Globulin, Total: 2.5 g/dL (ref 1.5–4.5)
Glucose: 79 mg/dL (ref 70–99)
Potassium: 4.1 mmol/L (ref 3.5–5.2)
Sodium: 138 mmol/L (ref 134–144)
Total Protein: 7.3 g/dL (ref 6.0–8.5)
eGFR: 96 mL/min/{1.73_m2} (ref >59)

## 2022-02-09 LAB — CBC WITH DIFFERENTIAL/PLATELET
Basophils Absolute: 0 10*3/uL (ref 0.0–0.2)
Basos: 1 %
EOS (ABSOLUTE): 0.1 10*3/uL (ref 0.0–0.4)
Eos: 1 %
Hematocrit: 42.5 % (ref 34.0–46.6)
Hemoglobin: 14.1 g/dL (ref 11.1–15.9)
Immature Grans (Abs): 0 10*3/uL (ref 0.0–0.1)
Immature Granulocytes: 0 %
Lymphocytes Absolute: 1.5 10*3/uL (ref 0.7–3.1)
Lymphs: 20 %
MCH: 29.7 pg (ref 26.6–33.0)
MCHC: 33.2 g/dL (ref 31.5–35.7)
MCV: 90 fL (ref 79–97)
Monocytes Absolute: 0.6 10*3/uL (ref 0.1–0.9)
Monocytes: 8 %
Neutrophils Absolute: 5.1 10*3/uL (ref 1.4–7.0)
Neutrophils: 70 %
Platelets: 266 10*3/uL (ref 150–450)
RBC: 4.75 x10E6/uL (ref 3.77–5.28)
RDW: 13.1 % (ref 11.7–15.4)
WBC: 7.3 10*3/uL (ref 3.4–10.8)

## 2022-02-09 LAB — IRON,TIBC AND FERRITIN PANEL
Ferritin: 55 ng/mL (ref 15–150)
Iron Saturation: 9 % — CL (ref 15–55)
Iron: 36 ug/dL (ref 27–159)
Total Iron Binding Capacity: 396 ug/dL (ref 250–450)
UIBC: 360 ug/dL (ref 131–425)

## 2022-02-09 LAB — HCV INTERPRETATION

## 2022-02-09 LAB — HCV AB W REFLEX TO QUANT PCR: HCV Ab: NONREACTIVE

## 2022-02-09 LAB — HIV ANTIBODY (ROUTINE TESTING W REFLEX): HIV Screen 4th Generation wRfx: NONREACTIVE

## 2022-02-11 ENCOUNTER — Encounter: Payer: Self-pay | Admitting: Internal Medicine

## 2022-02-11 DIAGNOSIS — Z0000 Encounter for general adult medical examination without abnormal findings: Secondary | ICD-10-CM | POA: Insufficient documentation

## 2022-02-11 DIAGNOSIS — F909 Attention-deficit hyperactivity disorder, unspecified type: Secondary | ICD-10-CM | POA: Insufficient documentation

## 2022-02-11 DIAGNOSIS — M79675 Pain in left toe(s): Secondary | ICD-10-CM | POA: Insufficient documentation

## 2022-02-11 DIAGNOSIS — Z8719 Personal history of other diseases of the digestive system: Secondary | ICD-10-CM | POA: Insufficient documentation

## 2022-02-11 HISTORY — DX: Other specified soft tissue disorders: M79.675

## 2022-02-11 HISTORY — DX: Encounter for general adult medical examination without abnormal findings: Z00.00

## 2022-02-11 NOTE — Assessment & Plan Note (Signed)
-  Baseline labs ordered today, including one-time screenings for HIV/HCV -Influenza vaccine administered today -Additional HM items up-to-date

## 2022-02-11 NOTE — Assessment & Plan Note (Signed)
She endorses a history of ADHD, most recently prescribed Vyvanse.  However, she does not feel as though this medication is working any longer and she wants to stop the medication.  She is interested in starting new medication, however she would like to speak with a psychiatrist about what is most appropriate for her.  I have placed a referral today.

## 2022-02-11 NOTE — Assessment & Plan Note (Addendum)
Today she endorses abnormal uterine bleeding and vaginal odor.  She was evaluated by [facility] last month for the symptoms.  There has been no improvement.  She has follow-up scheduled for next month.  Urine pregnancy test negative today.  Checking CBC and iron studies as well.

## 2022-02-11 NOTE — Assessment & Plan Note (Addendum)
Today she reports rectal bleeding.  She states states she has experienced the symptoms in the past and has been told that there was an issue with her intestines.  She reports previously undergoing endoscopic procedures to "untwist" her intestines.  On chart review, I am able to see where she has a history of intussusception.  She is planning to contact her gastroenterologist for a follow-up appointment and further evaluation.  Checking CBC and iron studies today.

## 2022-02-11 NOTE — Assessment & Plan Note (Signed)
She reports recently injuring the second digit of left foot.  On exam today there is pain, swelling, discoloration, and limited ROM at the DIP of the second digit of left foot.  I have ordered x-rays today.

## 2022-02-28 ENCOUNTER — Ambulatory Visit: Payer: Medicaid Other | Admitting: Advanced Practice Midwife

## 2022-03-11 ENCOUNTER — Encounter (HOSPITAL_COMMUNITY): Payer: Self-pay | Admitting: Psychiatry

## 2022-03-11 ENCOUNTER — Telehealth (INDEPENDENT_AMBULATORY_CARE_PROVIDER_SITE_OTHER): Payer: Medicaid Other | Admitting: Psychiatry

## 2022-03-11 DIAGNOSIS — Z9151 Personal history of suicidal behavior: Secondary | ICD-10-CM

## 2022-03-11 DIAGNOSIS — Z62810 Personal history of physical and sexual abuse in childhood: Secondary | ICD-10-CM

## 2022-03-11 DIAGNOSIS — F4001 Agoraphobia with panic disorder: Secondary | ICD-10-CM

## 2022-03-11 DIAGNOSIS — G43009 Migraine without aura, not intractable, without status migrainosus: Secondary | ICD-10-CM

## 2022-03-11 DIAGNOSIS — F411 Generalized anxiety disorder: Secondary | ICD-10-CM

## 2022-03-11 DIAGNOSIS — F4310 Post-traumatic stress disorder, unspecified: Secondary | ICD-10-CM | POA: Diagnosis not present

## 2022-03-11 DIAGNOSIS — L659 Nonscarring hair loss, unspecified: Secondary | ICD-10-CM

## 2022-03-11 DIAGNOSIS — F332 Major depressive disorder, recurrent severe without psychotic features: Secondary | ICD-10-CM | POA: Diagnosis not present

## 2022-03-11 DIAGNOSIS — G8929 Other chronic pain: Secondary | ICD-10-CM | POA: Insufficient documentation

## 2022-03-11 DIAGNOSIS — Z87891 Personal history of nicotine dependence: Secondary | ICD-10-CM | POA: Insufficient documentation

## 2022-03-11 DIAGNOSIS — R197 Diarrhea, unspecified: Secondary | ICD-10-CM | POA: Insufficient documentation

## 2022-03-11 DIAGNOSIS — F331 Major depressive disorder, recurrent, moderate: Secondary | ICD-10-CM | POA: Insufficient documentation

## 2022-03-11 DIAGNOSIS — F410 Panic disorder [episodic paroxysmal anxiety] without agoraphobia: Secondary | ICD-10-CM | POA: Insufficient documentation

## 2022-03-11 DIAGNOSIS — F17290 Nicotine dependence, other tobacco product, uncomplicated: Secondary | ICD-10-CM

## 2022-03-11 HISTORY — DX: Nonscarring hair loss, unspecified: L65.9

## 2022-03-11 MED ORDER — DULOXETINE HCL 20 MG PO CPEP: 20.0000 mg | ORAL_CAPSULE | Freq: Every day | ORAL | 0 refills | Status: DC
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
## 2022-03-11 NOTE — Patient Instructions (Signed)
We formally discontinued your amitriptyline today since you haven't taken it in several months. We added duloxetine (cymbalta) to your regimen. Take 20mg  (1 pill) once a day in the morning. This should begin to help with your low mood and panic attacks. Also, be sure to use that breathing skills we practiced in our visit today. Dr. Nehemiah Settle will place a referral to start psychotherapy with our office as well.

## 2022-03-22 ENCOUNTER — Encounter (HOSPITAL_COMMUNITY): Payer: Self-pay

## 2022-03-22 ENCOUNTER — Ambulatory Visit (INDEPENDENT_AMBULATORY_CARE_PROVIDER_SITE_OTHER): Payer: Medicaid Other | Admitting: Clinical

## 2022-03-22 DIAGNOSIS — F410 Panic disorder [episodic paroxysmal anxiety] without agoraphobia: Secondary | ICD-10-CM | POA: Diagnosis not present

## 2022-03-22 DIAGNOSIS — F902 Attention-deficit hyperactivity disorder, combined type: Secondary | ICD-10-CM | POA: Diagnosis not present

## 2022-03-22 DIAGNOSIS — F411 Generalized anxiety disorder: Secondary | ICD-10-CM

## 2022-03-22 DIAGNOSIS — F4310 Post-traumatic stress disorder, unspecified: Secondary | ICD-10-CM | POA: Diagnosis not present

## 2022-03-22 DIAGNOSIS — F332 Major depressive disorder, recurrent severe without psychotic features: Secondary | ICD-10-CM | POA: Diagnosis not present

## 2022-03-22 NOTE — Plan of Care (Signed)
Verbal Consent 
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
## 2022-04-08 ENCOUNTER — Ambulatory Visit (INDEPENDENT_AMBULATORY_CARE_PROVIDER_SITE_OTHER): Payer: Medicaid Other | Admitting: Clinical

## 2022-04-08 DIAGNOSIS — F4310 Post-traumatic stress disorder, unspecified: Secondary | ICD-10-CM | POA: Diagnosis not present

## 2022-04-08 DIAGNOSIS — F411 Generalized anxiety disorder: Secondary | ICD-10-CM

## 2022-04-08 DIAGNOSIS — F332 Major depressive disorder, recurrent severe without psychotic features: Secondary | ICD-10-CM | POA: Diagnosis not present

## 2022-04-08 DIAGNOSIS — F410 Panic disorder [episodic paroxysmal anxiety] without agoraphobia: Secondary | ICD-10-CM | POA: Diagnosis not present

## 2022-04-08 DIAGNOSIS — F902 Attention-deficit hyperactivity disorder, combined type: Secondary | ICD-10-CM

## 2022-04-08 NOTE — Progress Notes (Signed)
Virtual Visit via Telephone Note  I connected with Patty Gomez on 04/08/22 at  1:00 PM EDT by telephone and verified that I am speaking with the correct person using two identifiers.  Location: Patient: Home Provider: Office   I discussed the limitations, risks, security and privacy concerns of performing an evaluation and management service by telephone and the availability of in person appointments. I also discussed with the patient that there may be a patient responsible charge related to this service. The patient expressed understanding and agreed to proceed.   THERAPY PROGRESS NOTE   Session Time: 1:00 PM- 1:55 PM   Participation Level: Active   Behavioral Response: CasualAlertDepressed   Type of Therapy: Individual Therapy   Treatment Goals addressed: Coping   Interventions: CBT, DBT, Solution Focused, Strength-based and Supportive   Summary: Patty Gomez is a 21y.o. female who presents with Depression, Anxiety, PTSD, ADHD . The OPT therapist worked with the patient for her OPT treatment session. The OPT therapist utilized Motivational Interviewing to assist in creating therapeutic repore. The patient in the session was engaged and work in collaboration giving feedback about her triggers and symptoms over the past few weeks. The patient spoke about her ongoing physical health problems and noted upcoming GI appointment in November. The OPT therapist worked with the patient in a exercise in session to identify and challenge (ANTS) and implement positive self mantra. The patient spoke about looking forward to finding answer for her physical health symptoms.   Suicidal/Homicidal: Nowithout intent/plan   Therapist Response:The OPT therapist worked with the patient for the patients scheduled session. The patient was engaged in her session and gave feedback in relation to triggers, symptoms, and behavior responses over the past few weeks. The patient identified allowing automatic  negative thoughts impact her mood and self esteem. The OPT therapist worked with the patient utilizing an in session Cognitive Behavioral Therapy exercise. The patient was responsive in the session and verbalized, " I know I am going to figure out what is going on with me physically I am 21 and I eat healthy and I am still having no energy and nausea in the mornings and my Doctor told me I needed to have a Colonoscopy and I have it scheduled now for November ". The OPT therapist worked with the patient overviewing basic health areas including sleep cycle, eating habits, hygiene, and physical exercise. The patient in this session verbalized no current S/I or H/I. The OPT therapist will continue treatment work with the patient in her next scheduled session.   Plan: Return again in 2/3 weeks.   Diagnosis:      Axis I:Depression GAD PTSD ADHD                           Axis II: No diagnosis       Collaboration of Care: Overview of medication management program involvement with psychiatrist Dr. Harrington Challenger.   Patient/Guardian was advised Release of Information must be obtained prior to any record release in order to collaborate their care with an outside provider. Patient/Guardian was advised if they have not already done so to contact the registration department to sign all necessary forms in order for [place] to release information regarding their care.    Consent: Patient/Guardian gives verbal consent for treatment and assignment of benefits for services provided during this visit. Patient/Guardian expressed understanding and agreed to proceed.    I discussed the assessment and treatment plan with  the patient. The patient was provided an opportunity to ask questions and all were answered. The patient agreed with the plan and demonstrated an understanding of the instructions.   The patient was advised to call back or seek an in-person evaluation if the symptoms worsen or if the condition fails to improve as  anticipated.   I provided 55 minutes of non-face-to-face time during this encounter.   Maye Hides, LCSW   04/08/2022

## 2022-04-09 ENCOUNTER — Ambulatory Visit (INDEPENDENT_AMBULATORY_CARE_PROVIDER_SITE_OTHER): Payer: Medicaid Other | Admitting: Internal Medicine

## 2022-04-09 ENCOUNTER — Encounter: Payer: Self-pay | Admitting: Internal Medicine

## 2022-04-09 DIAGNOSIS — F332 Major depressive disorder, recurrent severe without psychotic features: Secondary | ICD-10-CM

## 2022-04-09 DIAGNOSIS — F4310 Post-traumatic stress disorder, unspecified: Secondary | ICD-10-CM

## 2022-04-09 NOTE — Progress Notes (Signed)
Acute Telephone Visit  Virtual Visit via Telephone Note  I connected with Patty Gomez on 04/09/22 at  1:40 PM EDT by telephone and verified that I am speaking with the correct person using two identifiers.  Location: Patient: [address], [place], [place] [address] Provider: [address], [place], [place] [address]   I discussed the limitations, risks, security and privacy concerns of performing an evaluation and management service by telephone and the availability of in person appointments. I also discussed with the patient that there may be a patient responsible charge related to this service. The patient expressed understanding and agreed to proceed.   History of Present Illness:  Patty Gomez is seen today for an acute telephone encounter to discuss her current medications.  She was last seen by me on 8/25 to establish care.  At that time she endorsed a history of ADHD and stated that she had most recently been prescribed Vyvanse.  She did not feel like the medication was working any longer and she wanted to stop taking the medication.  She also expressed interest in establishing care with psychiatry to discuss what medication would be most appropriate for her.  In the interim, she has been seen by Dr. Adrian Blackwater, psychiatry, on 9/25.  She was started on duloxetine 20 mg daily.  She is also seeing a Veterinary surgeon.  Her last session was yesterday (10/23).  Patty Gomez states that she recently made a mistake at work and is now unemployed.  She expresses remorse for stopping medication for ADHD.  She wants to know what needs to be done in order to establish a diagnosis of ADHD daily, because she learned at her appointment with Dr. Adrian Blackwater that it did not appear she had ever formally been diagnosed with ADHD and that many of her symptoms are consistent with mood disorder, such as MDD and PTSD.  Patty Gomez currently denies SI/HI.  She was originally scheduled for an in person appointment, but switched to a  telephone encounter because she does not feel well today.  She endorses chronic GI symptoms.  Of note, she reported rectal bleeding and a history of intussusception at her last appointment.  Iron studies were consistent with iron deficiency and iron supplementation was recommended.  She is scheduled for follow-up with gastroenterology at [facility] next month.  Assessment and Plan:  PTSD MDD Reported history of ADHD Evaluated via acute telephone encounter today to discuss her current medications.  She was recently started on Cymbalta 20 mg daily for treatment of PTSD/MDD.  She was previously taking Vyvanse for treatment of ADHD, but no formal testing for ADHD is on record.  She is concerned that she needs to restart medication for treatment of ADHD and thinks she made a mistake and requesting to come off of medication.  I explained Dr. Denyce Robert rationale in starting Cymbalta, particularly in the absence of a prior formal diagnosis of ADHD and her history of PTSD/MDD.  She expressed understanding.  I also reminded her that she has a follow-up appointment with Dr. Adrian Blackwater scheduled for tomorrow morning (10/25) at 9 AM.  I recommended that she relay her concerns at this appointment and that I would defer any medication adjustments today until she is able to speak with Dr. Adrian Blackwater tomorrow.  She is in agreement with this plan.  Follow Up Instructions:  I discussed the assessment and treatment plan with the patient. The patient was provided an opportunity to ask questions and all were answered. The  patient agreed with the plan and demonstrated an understanding of the instructions.   The patient was advised to call back or seek an in-person evaluation if the symptoms worsen or if the condition fails to improve as anticipated.  I provided 14 minutes of non-face-to-face time during this encounter.   Johnette Abraham, MD

## 2022-04-10 ENCOUNTER — Telehealth (HOSPITAL_COMMUNITY): Payer: Medicaid Other | Admitting: Psychiatry

## 2022-04-12 ENCOUNTER — Telehealth (HOSPITAL_COMMUNITY): Payer: Medicaid Other | Admitting: Psychiatry

## 2022-04-15 ENCOUNTER — Encounter: Payer: Self-pay | Admitting: Internal Medicine

## 2022-04-15 ENCOUNTER — Ambulatory Visit (INDEPENDENT_AMBULATORY_CARE_PROVIDER_SITE_OTHER): Payer: Medicaid Other | Admitting: Internal Medicine

## 2022-04-15 ENCOUNTER — Ambulatory Visit (HOSPITAL_COMMUNITY)
Admission: RE | Admit: 2022-04-15 | Discharge: 2022-04-15 | Disposition: A | Discharge status: Home / Self Care | Payer: Medicaid Other | Source: Ambulatory Visit | Attending: Internal Medicine | Admitting: Internal Medicine

## 2022-04-15 VITALS — BP 133/82 | HR 75 | Ht 69.5 in | Wt 184.0 lb

## 2022-04-15 DIAGNOSIS — R160 Hepatomegaly, not elsewhere classified: Secondary | ICD-10-CM | POA: Diagnosis not present

## 2022-04-15 DIAGNOSIS — R109 Unspecified abdominal pain: Secondary | ICD-10-CM | POA: Insufficient documentation

## 2022-04-15 LAB — POCT URINE PREGNANCY: Preg Test, Ur: NEGATIVE

## 2022-04-15 NOTE — Progress Notes (Signed)
Acute Office Visit  Subjective:     Patient ID: Patty Gomez, female    DOB: 28-Apr-2001, 21 y.o.   MRN: [id number]  Chief Complaint  Patient presents with   Abdominal Pain    Had for 5 years and just getting worse. Hurts to walk, move or have anything touching abdomin area.   Patty Gomez presents today for an acute visit to discuss lower abdominal pain.  She endorses a 5-year history of chronic abdominal pain and states that it has acutely worsened over the last 4 days.  She currently states that it hurts to move or touch her lower abdominal area.  She endorses symptoms of dysuria as well.  She denies hematochezia or melena.  Of note, her past medical history is remarkable for intussusception.  She is followed by gastroenterology at [facility].  Patty Gomez denies diarrhea.  She additionally denies fever/chills.  She has not had any abnormal uterine bleeding or vaginal discharge.  She is currently sexually active with 1 partner and has an IUD but is not using any additional form of contraception.  Review of Systems  Constitutional:  Negative for chills and fever.  Gastrointestinal:  Positive for abdominal pain (Lower abdominal pain). Negative for blood in stool, constipation, diarrhea, melena, nausea and vomiting.  Genitourinary:  Positive for dysuria and flank pain. Negative for hematuria.  All other systems reviewed and are negative.     Objective:    BP 133/82   Pulse 75   Ht 5' 9.5" (1.765 m)   Wt 184 lb (83.5 kg)   SpO2 93%   BMI 26.78 kg/m   Physical Exam Vitals reviewed.  Constitutional:      General: She is in acute distress (Unable to sit still due to discomfort).  HENT:     Head: Normocephalic and atraumatic.  Cardiovascular:     Rate and Rhythm: Tachycardia present.     Heart sounds: Normal heart sounds.  Pulmonary:     Effort: Pulmonary effort is normal.     Breath sounds: Normal breath sounds. No wheezing, rhonchi or rales.  Abdominal:      General: Abdomen is flat. Bowel sounds are normal. There is no distension.     Palpations: Abdomen is soft.     Tenderness: There is abdominal tenderness in the periumbilical area and suprapubic area. There is guarding. There is no right CVA tenderness or left CVA tenderness.  Skin:    General: Skin is warm and dry.     Coloration: Skin is not jaundiced.  Neurological:     Mental Status: She is alert.  Psychiatric:        Mood and Affect: Mood is anxious.       Assessment & Plan:   Problem List Items Addressed This Visit       Acute abdominal pain - Primary    Presenting today for evaluation of acute on chronic abdominal pain.  She endorses a 5-year history of chronic lower abdominal pain that has acutely worsened over the last 4 days.  She is in significant discomfort today.  Exam is limited due to guarding and exquisite lower abdominal tenderness.  She became tearful during my exam.  She endorses dysuria, but largely denies further associated symptoms.  There is no CVA tenderness on exam.  Her past medical history is remarkable for intussusception.  In addition to GI etiologies, consideration must be given to GYN and GU sources. -STAT CT abdomen pelvis ordered today -UA ordered to  assess for possible urinary tract infection/pyelonephritis -POC urine pregnancy test ordered      Return if symptoms worsen or fail to improve.  Billie Lade, MD

## 2022-04-16 ENCOUNTER — Ambulatory Visit (INDEPENDENT_AMBULATORY_CARE_PROVIDER_SITE_OTHER): Payer: Medicaid Other | Admitting: Adult Health

## 2022-04-16 ENCOUNTER — Encounter: Payer: Self-pay | Admitting: Adult Health

## 2022-04-16 VITALS — BP 136/105 | HR 68 | Ht 69.5 in | Wt 185.0 lb

## 2022-04-16 DIAGNOSIS — R1013 Epigastric pain: Secondary | ICD-10-CM | POA: Diagnosis not present

## 2022-04-16 DIAGNOSIS — Z975 Presence of (intrauterine) contraceptive device: Secondary | ICD-10-CM

## 2022-04-16 DIAGNOSIS — R1033 Periumbilical pain: Secondary | ICD-10-CM | POA: Diagnosis not present

## 2022-04-16 DIAGNOSIS — R112 Nausea with vomiting, unspecified: Secondary | ICD-10-CM | POA: Diagnosis not present

## 2022-04-16 DIAGNOSIS — N83201 Unspecified ovarian cyst, right side: Secondary | ICD-10-CM | POA: Insufficient documentation

## 2022-04-16 MED ORDER — DICYCLOMINE HCL 10 MG PO CAPS: 10.0000 mg | ORAL_CAPSULE | Freq: Three times a day (TID) | ORAL | 1 refills | Status: DC

## 2022-04-16 MED ORDER — PROMETHAZINE HCL 25 MG PO TABS: 25.0000 mg | ORAL_TABLET | Freq: Four times a day (QID) | ORAL | 1 refills | Status: DC | PRN

## 2022-04-16 NOTE — Progress Notes (Addendum)
  Subjective:     Patient ID: Patty Gomez, female   DOB: September 23, 2000, 21 y.o.   MRN: [id number]  HPI Patty Gomez is a 21 year old white female,single, G0P0, in complaining of abdominal pain for at least 4 years and it comes and goes. She is complaining of nausea and vomiting, now. And low back pain. She says has seen GI in past. Has urine culture pending   She saw PCP yesterday and had CT:IMPRESSION: There is no evidence of intestinal obstruction or pneumoperitoneum. There is no hydronephrosis.   There is 4.7 cm smooth marginated fluid density structure in the right adnexa suggesting possible functional right ovarian cyst.   No follow-up imaging is recommended.Reference: JACR 2020 Feb;17(2):248-254   Enlarged liver.   Other findings as described in the body of the report.   Last pap was negative 01/09/22.  PCP is Dr Doren Custard.  Review of Systems Abdominal pain +nausea and vomiting Has diarrhea more than constipation Not having sex currently Reviewed past medical,surgical, social and family history. Reviewed medications and allergies.     Objective:   Physical Exam BP (!) 136/105 (BP Location: Left Arm, Patient Position: Sitting, Cuff Size: Normal)   Pulse 68   Ht 5' 9.5" (1.765 m)   Wt 185 lb (83.9 kg)   BMI 26.93 kg/m     Skin warm and dry.Abdomen is soft and tender in epigastric area and at navel. No HSM noted. NO CVAT, is tender low back.  Pelvic: external genitalia is normal in appearance no lesions, vagina: pink blood,urethra has no lesions or masses noted, cervix:smooth+IUD strings, no CMT, uterus: normal size, shape and contour, non tender, no masses felt, adnexa: no masses or tenderness noted. Bladder is non tender and no masses felt.   Assessment:     1. Epigastric pain Most of the tenderness and pain is above navel in epigastric area Will check labs  - CBC w/Diff - Lipase - Amylase - Comprehensive metabolic panel  2. Periumbilical abdominal pain - CBC w/Diff -  Lipase - Amylase - Comprehensive metabolic panel Can take tylenol and ibuprofen for pain as needed    3. Nausea and vomiting, unspecified vomiting type Will rx bentyl and phenergan  Meds ordered this encounter  Medications   promethazine (PHENERGAN) 25 MG tablet    Sig: Take 1 tablet (25 mg total) by mouth every 6 (six) hours as needed for nausea or vomiting.    Dispense:  30 tablet    Refill:  1    Order Specific Question:   Supervising Provider    Answer:   Tania Ade H [[id number]]   dicyclomine (BENTYL) 10 MG capsule    Sig: Take 1 capsule (10 mg total) by mouth 4 (four) times daily -  before meals and at bedtime.    Dispense:  90 capsule    Refill:  1    Order Specific Question:   Supervising Provider    Answer:   Elonda Husky, LUTHER H [[id number]]    4. Right ovarian cyst It looks functional Not tender RLQ  5. IUD (intrauterine device) in place IUD in place on CT     Plan:     Follow up in 3 days  She aware that feel this is more GI then GYN related and may need to follow up with GI

## 2022-04-17 DIAGNOSIS — K648 Other hemorrhoids: Secondary | ICD-10-CM | POA: Diagnosis not present

## 2022-04-17 DIAGNOSIS — R1115 Cyclical vomiting syndrome unrelated to migraine: Secondary | ICD-10-CM | POA: Diagnosis not present

## 2022-04-17 LAB — COMPREHENSIVE METABOLIC PANEL
ALT: 8 IU/L (ref 0–32)
AST: 15 IU/L (ref 0–40)
Albumin/Globulin Ratio: 2 (ref 1.2–2.2)
Albumin: 5 g/dL (ref 4.0–5.0)
Alkaline Phosphatase: 65 IU/L (ref 44–121)
BUN/Creatinine Ratio: 13 (ref 9–23)
BUN: 11 mg/dL (ref 6–20)
Bilirubin Total: 1 mg/dL (ref 0.0–1.2)
CO2: 22 mmol/L (ref 20–29)
Calcium: 9.8 mg/dL (ref 8.7–10.2)
Chloride: 94 mmol/L — ABNORMAL LOW (ref 96–106)
Creatinine, Ser: 0.84 mg/dL (ref 0.57–1.00)
Globulin, Total: 2.5 g/dL (ref 1.5–4.5)
Glucose: 110 mg/dL — ABNORMAL HIGH (ref 70–99)
Potassium: 3.5 mmol/L (ref 3.5–5.2)
Sodium: 137 mmol/L (ref 134–144)
Total Protein: 7.5 g/dL (ref 6.0–8.5)
eGFR: 101 mL/min/{1.73_m2} (ref >59)

## 2022-04-17 LAB — CBC WITH DIFFERENTIAL/PLATELET
Basophils Absolute: 0 10*3/uL (ref 0.0–0.2)
Basos: 0 %
EOS (ABSOLUTE): 0 10*3/uL (ref 0.0–0.4)
Eos: 0 %
Hematocrit: 40.4 % (ref 34.0–46.6)
Hemoglobin: 14.2 g/dL (ref 11.1–15.9)
Immature Grans (Abs): 0 10*3/uL (ref 0.0–0.1)
Immature Granulocytes: 0 %
Lymphocytes Absolute: 1.2 10*3/uL (ref 0.7–3.1)
Lymphs: 11 %
MCH: 30.3 pg (ref 26.6–33.0)
MCHC: 35.1 g/dL (ref 31.5–35.7)
MCV: 86 fL (ref 79–97)
Monocytes Absolute: 0.7 10*3/uL (ref 0.1–0.9)
Monocytes: 7 %
Neutrophils Absolute: 8.6 10*3/uL — ABNORMAL HIGH (ref 1.4–7.0)
Neutrophils: 82 %
Platelets: 282 10*3/uL (ref 150–450)
RBC: 4.69 x10E6/uL (ref 3.77–5.28)
RDW: 12.1 % (ref 11.7–15.4)
WBC: 10.5 10*3/uL (ref 3.4–10.8)

## 2022-04-17 LAB — AMYLASE: Amylase: 27 U/L — ABNORMAL LOW (ref 31–110)

## 2022-04-17 LAB — LIPASE: Lipase: 22 U/L (ref 14–72)

## 2022-04-19 ENCOUNTER — Ambulatory Visit: Payer: Medicaid Other | Admitting: Adult Health

## 2022-04-19 LAB — UA/M W/RFLX CULTURE, ROUTINE
Bilirubin, UA: NEGATIVE
Glucose, UA: NEGATIVE
Leukocytes,UA: NEGATIVE
Nitrite, UA: NEGATIVE
RBC, UA: NEGATIVE
Specific Gravity, UA: 1.024 (ref 1.005–1.030)
Urobilinogen, Ur: 0.2 mg/dL (ref 0.2–1.0)
pH, UA: 6.5 (ref 5.0–7.5)

## 2022-04-19 LAB — MICROSCOPIC EXAMINATION
Casts: NONE SEEN /lpf
Epithelial Cells (non renal): >10 /hpf — AB (ref 0–10)

## 2022-04-19 LAB — URINE CULTURE, REFLEX

## 2022-04-21 ENCOUNTER — Encounter: Payer: Self-pay | Admitting: Internal Medicine

## 2022-04-21 NOTE — Assessment & Plan Note (Signed)
Presenting today for evaluation of acute on chronic abdominal pain.  She endorses a 5-year history of chronic lower abdominal pain that has acutely worsened over the last 4 days.  She is in significant discomfort today.  Exam is limited due to guarding and exquisite lower abdominal tenderness.  She became tearful during my exam.  She endorses dysuria, but largely denies further associated symptoms.  There is no CVA tenderness on exam.  Her past medical history is remarkable for intussusception.  In addition to GI etiologies, consideration must be given to GYN and GU sources. -STAT CT abdomen pelvis ordered today -UA ordered to assess for possible urinary tract infection/pyelonephritis -POC urine pregnancy test ordered

## 2022-04-23 ENCOUNTER — Ambulatory Visit: Payer: Medicaid Other | Admitting: Adult Health

## 2022-05-02 DIAGNOSIS — K648 Other hemorrhoids: Secondary | ICD-10-CM | POA: Diagnosis not present

## 2022-05-06 ENCOUNTER — Ambulatory Visit (INDEPENDENT_AMBULATORY_CARE_PROVIDER_SITE_OTHER): Payer: Medicaid Other | Admitting: Clinical

## 2022-05-06 DIAGNOSIS — F411 Generalized anxiety disorder: Secondary | ICD-10-CM | POA: Diagnosis not present

## 2022-05-06 DIAGNOSIS — F902 Attention-deficit hyperactivity disorder, combined type: Secondary | ICD-10-CM | POA: Diagnosis not present

## 2022-05-06 DIAGNOSIS — F4310 Post-traumatic stress disorder, unspecified: Secondary | ICD-10-CM | POA: Diagnosis not present

## 2022-05-06 DIAGNOSIS — F410 Panic disorder [episodic paroxysmal anxiety] without agoraphobia: Secondary | ICD-10-CM

## 2022-05-06 DIAGNOSIS — F332 Major depressive disorder, recurrent severe without psychotic features: Secondary | ICD-10-CM

## 2022-05-06 NOTE — Progress Notes (Signed)
Virtual Visit via Telephone Note   I connected with Patty Gomez on 05/09/22 at  10:00 AM EDT by telephone and verified that I am speaking with the correct person using two identifiers.   Location: Patient: Home Provider: Office   I discussed the limitations, risks, security and privacy concerns of performing an evaluation and management service by telephone and the availability of in person appointments. I also discussed with the patient that there may be a patient responsible charge related to this service. The patient expressed understanding and agreed to proceed.     THERAPY PROGRESS NOTE   Session Time: 10:00 AM- 10:30 AM   Participation Level: Active   Behavioral Response: CasualAlertDepressed   Type of Therapy: Individual Therapy   Treatment Goals addressed: Coping   Interventions: CBT, DBT, Solution Focused, Strength-based and Supportive   Summary: Patty Gomez is a 21y.o. female who presents with Depression, Anxiety, PTSD, ADHD . The OPT therapist worked with the patient for her OPT treatment session. The OPT therapist utilized Motivational Interviewing to assist in creating therapeutic repore. The patient in the session was engaged and work in collaboration giving feedback about her triggers and symptoms over the past few weeks. The patient spoke about learning she has a cycst on her ovaries and is working with her OBGYN for treatment. The patient however did recently miss scheduled OBGYN and Med Management appointments as indicated in her MyChart and noted she has not yet rescheduled the missed appointments.The OPT therapist worked with the patient in a exercise in session to identify and challenge (ANTS) and implement positive self mantra. The patient spoke about the impact of stress on her physical health creating early morning Nausea that impacts daily the patients functioning. Despite this the patient notes she still attempts to push through and clean her home and is unsure  if the Nausea. The patient spoke about her plans for the upcoming Thanksgiving holiday hoping she will be able to go to her Grandmothers. The patient spoke about coping skills she has been working to implement including praying and meditation. The patient spoke about ongoing work with her med management provider to help manage her anxiety. The patient spoke about external stressors including conflict with her landlord and living situation.The patient identified that her living situation and her boyfriend are her main stressors and she is thinking about making changes such as moving out and living with her grandmother and leaving her relationship. The patient spoke about her aspirations of going to college.    Suicidal/Homicidal: Nowithout intent/plan   Therapist Response:The OPT therapist worked with the patient for the patients scheduled session. The patient was engaged in her session and gave feedback in relation to triggers, symptoms, and behavior responses over the past few weeks. The patient identified allowing automatic negative thoughts impact her mood and self esteem. The OPT therapist worked with the patient utilizing an in session Cognitive Behavioral Therapy exercise. The patient was responsive in the session and verbalized, " I am still having no energy and nausea in the mornings and I think its from partially from stress and my living situation ". The OPT therapist worked with the patient overviewing basic health areas including sleep cycle, eating habits, hygiene, and physical exercise. The patient in this session verbalized no current S/I or H/I.  The patient spoke about considering making changes including moving in to her grandmothers and leaving her relationship.The OPT therapist will continue treatment work with the patient in her next scheduled session.   Plan:  Return again in 2/3 weeks.   Diagnosis:      Axis I:Depression GAD PTSD ADHD                           Axis II: No diagnosis        Collaboration of Care: Overview of medication management program involvement with psychiatrist Dr. Harrington Challenger.   Patient/Guardian was advised Release of Information must be obtained prior to any record release in order to collaborate their care with an outside provider. Patient/Guardian was advised if they have not already done so to contact the registration department to sign all necessary forms in order for [place] to release information regarding their care.    Consent: Patient/Guardian gives verbal consent for treatment and assignment of benefits for services provided during this visit. Patient/Guardian expressed understanding and agreed to proceed.    I discussed the assessment and treatment plan with the patient. The patient was provided an opportunity to ask questions and all were answered. The patient agreed with the plan and demonstrated an understanding of the instructions.   The patient was advised to call back or seek an in-person evaluation if the symptoms worsen or if the condition fails to improve as anticipated.   I provided 30 minutes of non-face-to-face time during this encounter.   Maye Hides, LCSW   05/06/2022

## 2022-05-13 ENCOUNTER — Encounter: Payer: Self-pay | Admitting: Internal Medicine

## 2022-05-13 ENCOUNTER — Ambulatory Visit (INDEPENDENT_AMBULATORY_CARE_PROVIDER_SITE_OTHER): Payer: Medicaid Other | Admitting: Internal Medicine

## 2022-05-13 VITALS — BP 103/64 | HR 77 | Ht 69.5 in | Wt 191.4 lb

## 2022-05-13 DIAGNOSIS — E611 Iron deficiency: Secondary | ICD-10-CM

## 2022-05-13 DIAGNOSIS — Z8719 Personal history of other diseases of the digestive system: Secondary | ICD-10-CM | POA: Diagnosis not present

## 2022-05-13 DIAGNOSIS — F332 Major depressive disorder, recurrent severe without psychotic features: Secondary | ICD-10-CM

## 2022-05-13 DIAGNOSIS — K588 Other irritable bowel syndrome: Secondary | ICD-10-CM

## 2022-05-13 DIAGNOSIS — F410 Panic disorder [episodic paroxysmal anxiety] without agoraphobia: Secondary | ICD-10-CM | POA: Diagnosis not present

## 2022-05-13 DIAGNOSIS — F411 Generalized anxiety disorder: Secondary | ICD-10-CM

## 2022-05-13 DIAGNOSIS — F4310 Post-traumatic stress disorder, unspecified: Secondary | ICD-10-CM

## 2022-05-13 DIAGNOSIS — F902 Attention-deficit hyperactivity disorder, combined type: Secondary | ICD-10-CM | POA: Diagnosis not present

## 2022-05-13 NOTE — Progress Notes (Unsigned)
Established Patient Office Visit  Subjective   Patient ID: Patty Gomez, female    DOB: 07-Apr-2001  Age: 21 y.o. MRN: [id number]  Chief Complaint  Patient presents with   Follow-up   [name] returns to care today.  She was last seen by me for an acute visit on 10/30 for acute abdominal pain.  A stat CT abdomen pelvis was ordered, which was subsequently negative for acute intra-abdominal pathology.  She was seen by OB/GYN as well and Bentyl was added.  Ultimately, her symptoms have been attributed to IBS.  She has recently undergone repeat sigmoidoscopy at [facility].  Previously [name] was seen by me on 8/25 to establish care.  Baseline labs were obtained at that time and 31-monthfollow-up was arranged.  Today [name] states that she feels well.  Her abdominal pain has significantly improved and she reports that she is now back on amitriptyline.  She has been prescribed Cymbalta for treatment of depression, but is not taking the medication.  I also recommended iron supplementation as her lab work was concerning for iron deficiency.  She stated that the supplementation made her feel nauseated and she is not taking it currently.  Acute concerns, chronic medical conditions, and outstanding preventative care items discussed today are individually addressed in A/P below.  Past Medical History:  Diagnosis Date   Acute bronchitis 01/20/2013   Anxiety state 08/22/2015   Depression    Encounter for IUD insertion 01/27/2018   Migraines    Pain and swelling of toe of left foot 02/11/2022   Screening examination for STD (sexually transmitted disease) 03/20/2021   Trichimoniasis    Vaginal burning 03/20/2021   Vaginal discharge 03/20/2021   Vaginal irritation 06/04/2021   Past Surgical History:  Procedure Laterality Date   BIOPSY  05/07/2019   Procedure: BIOPSY;  Surgeon: FDanie Binder MD;  Location: [facility];  Service: Endoscopy;;   COLONOSCOPY     ESOPHAGOGASTRODUODENOSCOPY  N/A 05/07/2019   Procedure: ESOPHAGOGASTRODUODENOSCOPY (EGD);  Surgeon: FDanie Binder MD;  Location: [facility];  Service: Endoscopy;  Laterality: N/A;  3:00pm   TONSILLECTOMY     Social History   Tobacco Use   Smoking status: Every Day    Types: E-cigarettes   Smokeless tobacco: Never   Tobacco comments:    1 vape cartridge will usually last her about a week  Vaping Use   Vaping Use: Some days  Substance Use Topics   Alcohol use: Not Currently    Comment: 1 shot per week. Previously more frequent with 2-3 shots at a time.   Drug use: Not Currently    Types: Marijuana    Comment: tried marijuana a few times as a teenager   Family History  Problem Relation Age of Onset   Bipolar disorder Mother    Cancer Mother    Hypertension Paternal Grandmother    Diabetes Paternal Grandmother    Hyperlipidemia Paternal Grandmother    Heart disease Paternal Grandmother    Colon polyps Paternal Grandmother    Diabetes Other    Cancer Other    Colon cancer Neg Hx    No Known Allergies  Review of Systems  Constitutional:  Negative for chills and fever.  HENT:  Negative for sore throat.   Respiratory:  Negative for cough and shortness of breath.   Cardiovascular:  Negative for chest pain, palpitations and leg swelling.  Gastrointestinal:  Negative for abdominal pain, blood in stool, constipation, diarrhea,  nausea and vomiting.  Genitourinary:  Negative for dysuria and hematuria.  Musculoskeletal:  Negative for myalgias.  Skin:  Negative for itching and rash.  Neurological:  Negative for dizziness and headaches.  Psychiatric/Behavioral:  Positive for depression ('feeling down'). Negative for suicidal ideas.      Objective:     BP 103/64   Pulse 77   Ht 5' 9.5" (1.765 m)   Wt 191 lb 6.4 oz (86.8 kg)   SpO2 98%   BMI 27.86 kg/m  BP Readings from Last 3 Encounters:  05/13/22 103/64  04/16/22 (!) 136/105  04/15/22 133/82   Physical Exam Vitals reviewed.   Constitutional:      General: She is not in acute distress.    Appearance: Normal appearance. She is not toxic-appearing.  HENT:     Head: Normocephalic and atraumatic.     Right Ear: External ear normal.     Left Ear: External ear normal.     Nose: Nose normal. No congestion or rhinorrhea.     Mouth/Throat:     Mouth: Mucous membranes are moist.     Pharynx: Oropharynx is clear. No oropharyngeal exudate or posterior oropharyngeal erythema.  Eyes:     General: No scleral icterus.    Extraocular Movements: Extraocular movements intact.     Conjunctiva/sclera: Conjunctivae normal.     Pupils: Pupils are equal, round, and reactive to light.  Cardiovascular:     Rate and Rhythm: Normal rate and regular rhythm.     Pulses: Normal pulses.     Heart sounds: Normal heart sounds. No murmur heard.    No friction rub. No gallop.  Pulmonary:     Effort: Pulmonary effort is normal.     Breath sounds: Normal breath sounds. No wheezing, rhonchi or rales.  Abdominal:     General: Abdomen is flat. Bowel sounds are normal. There is no distension.     Palpations: Abdomen is soft.     Tenderness: There is no abdominal tenderness.  Musculoskeletal:        General: No swelling. Normal range of motion.     Cervical back: Normal range of motion.     Right lower leg: No edema.     Left lower leg: No edema.  Lymphadenopathy:     Cervical: No cervical adenopathy.  Skin:    General: Skin is warm and dry.     Capillary Refill: Capillary refill takes less than 2 seconds.     Coloration: Skin is not jaundiced.  Neurological:     General: No focal deficit present.     Mental Status: She is alert and oriented to person, place, and time.  Psychiatric:        Mood and Affect: Mood normal.        Behavior: Behavior normal.    Last CBC Lab Results  Component Value Date   WBC 10.5 04/16/2022   HGB 14.2 04/16/2022   HCT 40.4 04/16/2022   MCV 86 04/16/2022   MCH 30.3 04/16/2022   RDW 12.1 04/16/2022    PLT 282 90/24/0973   Last metabolic panel Lab Results  Component Value Date   GLUCOSE 110 (H) 04/16/2022   NA 137 04/16/2022   K 3.5 04/16/2022   CL 94 (L) 04/16/2022   CO2 22 04/16/2022   BUN 11 04/16/2022   CREATININE 0.84 04/16/2022   EGFR 101 04/16/2022   CALCIUM 9.8 04/16/2022   PROT 7.5 04/16/2022   ALBUMIN 5.0 04/16/2022   LABGLOB 2.5 04/16/2022  AGRATIO 2.0 04/16/2022   BILITOT 1.0 04/16/2022   ALKPHOS 65 04/16/2022   AST 15 04/16/2022   ALT 8 04/16/2022   ANIONGAP 12 10/25/2019     Assessment & Plan:   Problem List Items Addressed This Visit       IBS (irritable bowel syndrome) - Primary    She recently stopped taking her medications, believing that they were no longer needed.  However, she had a recurrence of significant abdominal pain.  She is now on Bentyl and has restarted amitriptyline 25 mg daily.  She is followed by gastroenterology at [facility].  No medication changes today.      Severe episode of recurrent major depressive disorder, without psychotic features ([place])    Cymbalta was recently prescribed for management of MDD, PTSD, and GAD.  She is also followed by counseling.  She is not taking Cymbalta currently and plans to discuss this with her psychiatrist at follow-up in the near future.  She endorsed is feeling down today, but denies SI/HI.      Iron deficiency    Labs from August were concerning for iron deficiency.  Iron supplementation was recommended, however she endorsed nausea after taking dose.  We reviewed indication for iron supplementation today and how to take supplementation properly.  She states that she will try taking iron again.  If she continues to have side effects, can consider iron infusions.      Return in about 6 months (around 11/11/2022).    Johnette Abraham, MD

## 2022-05-13 NOTE — Patient Instructions (Signed)
It was a pleasure to see you today.  Thank you for giving [place] the opportunity to be involved in your care.  Below is a brief recap of your visit and next steps.  We will plan to see you again in 6 months.  Summary No medication changes today Preventative care items are up to date We will plan for follow up in 6 months

## 2022-05-15 DIAGNOSIS — E611 Iron deficiency: Secondary | ICD-10-CM | POA: Insufficient documentation

## 2022-05-15 HISTORY — DX: Iron deficiency: E61.1

## 2022-05-15 NOTE — Assessment & Plan Note (Signed)
Labs from August were concerning for iron deficiency.  Iron supplementation was recommended, however she endorsed nausea after taking dose.  We reviewed indication for iron supplementation today and how to take supplementation properly.  She states that she will try taking iron again.  If she continues to have side effects, can consider iron infusions.

## 2022-05-15 NOTE — Assessment & Plan Note (Signed)
Cymbalta was recently prescribed for management of MDD, PTSD, and GAD.  She is also followed by counseling.  She is not taking Cymbalta currently and plans to discuss this with her psychiatrist at follow-up in the near future.  She endorsed is feeling down today, but denies SI/HI.

## 2022-05-15 NOTE — Assessment & Plan Note (Signed)
She recently stopped taking her medications, believing that they were no longer needed.  However, she had a recurrence of significant abdominal pain.  She is now on Bentyl and has restarted amitriptyline 25 mg daily.  She is followed by gastroenterology at [facility].  No medication changes today.

## 2022-05-27 ENCOUNTER — Ambulatory Visit (INDEPENDENT_AMBULATORY_CARE_PROVIDER_SITE_OTHER): Payer: Medicaid Other | Admitting: Adult Health

## 2022-05-27 ENCOUNTER — Encounter: Payer: Self-pay | Admitting: Adult Health

## 2022-05-27 VITALS — BP 114/76 | HR 78 | Ht 69.5 in | Wt 187.0 lb

## 2022-05-27 DIAGNOSIS — Z8742 Personal history of other diseases of the female genital tract: Secondary | ICD-10-CM | POA: Diagnosis not present

## 2022-05-27 DIAGNOSIS — N939 Abnormal uterine and vaginal bleeding, unspecified: Secondary | ICD-10-CM | POA: Diagnosis not present

## 2022-05-27 DIAGNOSIS — Z975 Presence of (intrauterine) contraceptive device: Secondary | ICD-10-CM

## 2022-05-27 DIAGNOSIS — N898 Other specified noninflammatory disorders of vagina: Secondary | ICD-10-CM

## 2022-05-27 DIAGNOSIS — R1031 Right lower quadrant pain: Secondary | ICD-10-CM

## 2022-05-27 MED ORDER — DOXYCYCLINE HYCLATE 100 MG PO TABS: 100.0000 mg | ORAL_TABLET | Freq: Two times a day (BID) | ORAL | 0 refills | Status: DC

## 2022-05-27 MED ORDER — ONDANSETRON HCL 8 MG PO TABS: 8.0000 mg | ORAL_TABLET | Freq: Three times a day (TID) | ORAL | 0 refills | Status: DC | PRN

## 2022-05-27 MED ORDER — METRONIDAZOLE 500 MG PO TABS: 500.0000 mg | ORAL_TABLET | Freq: Two times a day (BID) | ORAL | 0 refills | Status: DC

## 2022-05-27 NOTE — Progress Notes (Signed)
Subjective:     Patient ID: Patty Gomez, female   DOB: March 27, 2001, 21 y.o.   MRN: [id number]  HPI Patty Gomez is a 21 year old white female,single, G0P0 in complaining of bleeding for 5 days with odor. Has RLQ pain and history of cyst. No sex in 7 months. Has IUD. She has nausea at times and follow up with GI this week.  Last pap was negative 01/09/22.  PCP is Dr Durwin Nora  Review of Systems Bleeding for 5 days with vaginal odor Has IUD  Has RLQ pain  Has nausea at times  No sex in about 7 months  Reviewed past medical,surgical, social and family history. Reviewed medications and allergies.     Objective:   Physical Exam BP 114/76 (BP Location: Left Arm, Patient Position: Sitting, Cuff Size: Normal)   Pulse 78   Ht 5' 9.5" (1.765 m)   Wt 187 lb (84.8 kg)   BMI 27.22 kg/m     Skin warm and dry.Pelvic: external genitalia is normal in appearance no lesions, vagina: +blood with odor,urethra has no lesions or masses noted, cervix:smooth,+IUD strings, uterus: normal size, shape and contour, non tender, no masses felt, adnexa: no masses or tenderness noted. Bladder is non tender and no masses felt. Fall risk is low  Upstream - 05/27/22 1535       Pregnancy Intention Screening   Does the patient want to become pregnant in the next year? Ok Either Way    Does the patient's partner want to become pregnant in the next year? Ok Either Way    Would the patient like to discuss contraceptive options today? Yes      Contraception Wrap Up   Current Method IUD or IUS    End Method IUD or IUS    Contraception Counseling Provided No            Examination chaperoned by Dorthula Perfect RN  Assessment:     1. Vaginal bleeding Bleeding x 5 days  Will rx doxycycline, it made her sleepy last time she took, will try again Will get pelvic US 06/03/22 at 10:30 am at [facility] to assess uterus and ovaries and IUD placement   Meds ordered this encounter  Medications   metroNIDAZOLE (FLAGYL) 500 MG  tablet    Sig: Take 1 tablet (500 mg total) by mouth 2 (two) times daily.    Dispense:  14 tablet    Refill:  0    Order Specific Question:   Supervising Provider    Answer:   Duane Lope H [[id number]]   doxycycline (VIBRA-TABS) 100 MG tablet    Sig: Take 1 tablet (100 mg total) by mouth 2 (two) times daily.    Dispense:  14 tablet    Refill:  0    Order Specific Question:   Supervising Provider    Answer:   Despina Hidden, LUTHER H [[id number]]   ondansetron (ZOFRAN) 8 MG tablet    Sig: Take 1 tablet (8 mg total) by mouth every 8 (eight) hours as needed for nausea or vomiting.    Dispense:  20 tablet    Refill:  0    Order Specific Question:   Supervising Provider    Answer:   Duane Lope H [[id number]]    - US PELVIC COMPLETE WITH TRANSVAGINAL; Future  2. RLQ abdominal pain +RLQ pain Will get pelvic US 06/03/22 at 10:30 am at [facility] to assess uterus and ovaries and IUD placement  - US PELVIC COMPLETE WITH TRANSVAGINAL; Future  3. Vaginal odor Will rx flagyl   4. History of ovarian cyst Will get pelvic US 06/03/22 at 10:30 am at [facility] to assess uterus and ovaries and IUD placement  - US PELVIC COMPLETE WITH TRANSVAGINAL; Future  5. IUD (intrauterine device) in place Will get pelvic US 06/03/22 at 10:30 am at [facility] to assess uterus and ovaries and IUD placement  - US PELVIC COMPLETE WITH TRANSVAGINAL; Future     Plan:    Will rx Zofran for nausea  Follow up in 9 days

## 2022-05-30 DIAGNOSIS — R1115 Cyclical vomiting syndrome unrelated to migraine: Secondary | ICD-10-CM | POA: Diagnosis not present

## 2022-05-30 DIAGNOSIS — K648 Other hemorrhoids: Secondary | ICD-10-CM | POA: Diagnosis not present

## 2022-06-03 ENCOUNTER — Ambulatory Visit (HOSPITAL_COMMUNITY)
Admission: RE | Admit: 2022-06-03 | Discharge: 2022-06-03 | Disposition: A | Discharge status: Home / Self Care | Payer: Medicaid Other | Source: Ambulatory Visit | Attending: Adult Health | Admitting: Adult Health

## 2022-06-03 DIAGNOSIS — Z8742 Personal history of other diseases of the female genital tract: Secondary | ICD-10-CM | POA: Diagnosis present

## 2022-06-03 DIAGNOSIS — Z975 Presence of (intrauterine) contraceptive device: Secondary | ICD-10-CM | POA: Diagnosis present

## 2022-06-03 DIAGNOSIS — R1031 Right lower quadrant pain: Secondary | ICD-10-CM | POA: Diagnosis present

## 2022-06-03 DIAGNOSIS — N939 Abnormal uterine and vaginal bleeding, unspecified: Secondary | ICD-10-CM | POA: Diagnosis present

## 2022-06-04 ENCOUNTER — Ambulatory Visit (INDEPENDENT_AMBULATORY_CARE_PROVIDER_SITE_OTHER): Payer: Medicaid Other | Admitting: Clinical

## 2022-06-04 DIAGNOSIS — F4310 Post-traumatic stress disorder, unspecified: Secondary | ICD-10-CM

## 2022-06-04 DIAGNOSIS — F332 Major depressive disorder, recurrent severe without psychotic features: Secondary | ICD-10-CM

## 2022-06-04 DIAGNOSIS — F410 Panic disorder [episodic paroxysmal anxiety] without agoraphobia: Secondary | ICD-10-CM

## 2022-06-04 DIAGNOSIS — F411 Generalized anxiety disorder: Secondary | ICD-10-CM | POA: Diagnosis not present

## 2022-06-04 DIAGNOSIS — F902 Attention-deficit hyperactivity disorder, combined type: Secondary | ICD-10-CM | POA: Diagnosis not present

## 2022-06-04 NOTE — Progress Notes (Signed)
Virtual Visit via Telephone Note   I connected with Patty Gomez on 06/04/22 at  1:00 PM EDT by telephone and verified that I am speaking with the correct person using two identifiers.   Location: Patient: Home Provider: Office   I discussed the limitations, risks, security and privacy concerns of performing an evaluation and management service by telephone and the availability of in person appointments. I also discussed with the patient that there may be a patient responsible charge related to this service. The patient expressed understanding and agreed to proceed.     THERAPY PROGRESS NOTE   Session Time: 1:00 PM- 1:30 PM   Participation Level: Active   Behavioral Response: CasualAlert/Hyperverbal   Type of Therapy: Individual Therapy   Treatment Goals addressed: Coping   Interventions: CBT, DBT, Solution Focused, Strength-based and Supportive   Summary: Patty Gomez is a 21y.o. female who presents with Depression, Anxiety, PTSD, ADHD . The OPT therapist worked with the patient for her OPT treatment session. The OPT therapist utilized Motivational Interviewing to assist in creating therapeutic repore. The patient in the session was engaged and work in collaboration giving feedback about her triggers and symptoms over the past few weeks. The patient spoke about doing better over the past few weeks. The patient spoke about her work to better control her anxiousness, nausea, and getting over heated. The patient spoke about working to be more active and keep her home clean. The patient spoke about her preparation for the upcoming Christmas holiday and spending time with her grandparents and family. The OPT therapist worked with the patient in a exercise in session to identify and challenge (ANTS) and implement positive self mantra. The patient spoke about ongoing work with her med management provider to help manage her anxiety. The patient spoke about external stressors including conflict  with her landlord and living situation.The patient identified her plan is to continue to work to move out due to being upset with her landlord after the holidays.    Suicidal/Homicidal: Nowithout intent/plan   Therapist Response:The OPT therapist worked with the patient for the patients scheduled session. The patient was engaged in her session and gave feedback in relation to triggers, symptoms, and behavior responses over the past few weeks. The patient identified allowing automatic negative thoughts impact her mood and self esteem. The OPT therapist worked with the patient utilizing an in session Cognitive Behavioral Therapy exercise. The patient was responsive in the session and verbalized, " I have been a lot better and I have been more active in cleaning my house and preparing for Christmas and I have been working on my anxiety and managing my nausea and my body getting overheated ". The OPT therapist worked with the patient overviewing basic health areas including sleep cycle, eating habits, hygiene, and physical exercise. The patient in this session verbalized no current S/I or H/I.  The patient spoke about considering making changes including after the holidays moving out due to her problems with the landlord and potentially moving in with her grandparents. The patient spoke about making better choices with her diet as well to help her with being less nauseous in the mornings. The patient spoke about taking an approach of being more adult-like and proactive with things that need to be taken care of. The patient has continued to care for her pet, read her bible, working on art, and is working to learn a new language,The OPT therapist will continue treatment work with the patient in her next scheduled  session.   Plan: Return again in 2/3 weeks.   Diagnosis:      Axis I:Depression GAD PTSD ADHD                           Axis II: No diagnosis       Collaboration of Care: Overview of medication  management program involvement with psychiatrist Dr. Tenny Craw.   Patient/Guardian was advised Release of Information must be obtained prior to any record release in order to collaborate their care with an outside provider. Patient/Guardian was advised if they have not already done so to contact the registration department to sign all necessary forms in order for [place] to release information regarding their care.    Consent: Patient/Guardian gives verbal consent for treatment and assignment of benefits for services provided during this visit. Patient/Guardian expressed understanding and agreed to proceed.    I discussed the assessment and treatment plan with the patient. The patient was provided an opportunity to ask questions and all were answered. The patient agreed with the plan and demonstrated an understanding of the instructions.   The patient was advised to call back or seek an in-person evaluation if the symptoms worsen or if the condition fails to improve as anticipated.   I provided 30 minutes of non-face-to-face time during this encounter.   Suzan Garibaldi, LCSW   06/04/2022

## 2022-06-05 ENCOUNTER — Ambulatory Visit: Payer: Medicaid Other | Admitting: Adult Health

## 2022-06-13 ENCOUNTER — Encounter: Payer: Self-pay | Admitting: Internal Medicine

## 2022-06-13 ENCOUNTER — Telehealth (INDEPENDENT_AMBULATORY_CARE_PROVIDER_SITE_OTHER): Payer: Medicaid Other | Admitting: Internal Medicine

## 2022-06-13 DIAGNOSIS — N939 Abnormal uterine and vaginal bleeding, unspecified: Secondary | ICD-10-CM | POA: Diagnosis not present

## 2022-06-13 NOTE — Progress Notes (Signed)
   Acute Virtual Visit  Virtual Visit via Telephone Note  I connected with Munira Wyss on 06/13/22 at  3:40 PM EST by telephone and verified that I am speaking with the correct person using two identifiers.  Location: Patient: [address] [place], [place] [address] Provider: [address] [place], [place] [address]   I discussed the limitations, risks, security and privacy concerns of performing an evaluation and management service by telephone and the availability of in person appointments. I also discussed with the patient that there may be a patient responsible charge related to this service. The patient expressed understanding and agreed to proceed.  Chief Complaint  Patient presents with   Menorrhagia    Pt reports abnormal bleeding since 05/27/2022 she went to see gyn on that day, is still awaiting results from ultrasound.    History of Present Illness:  Ms. Longenecker has been evaluated today through virtual encounter for vaginal bleeding.  She reports onset of vaginal bleeding on 12/11.  She was evaluated by OB/GYN that day.  Doxycycline, metronidazole, and Zofran were prescribed.  Pelvic ultrasound was also ordered.  [place] was unremarkable.  She has not taken doxycycline or metronidazole.  Zofran has been somewhat helpful in relieving her nausea.  She is concerned today because she has not been notified of the results of her ultrasound.  Her bleeding is also worse and she continues to have pelvic discomfort.  She has also noted blood when urinating/wiping.  Assessment and Plan:  Vaginal Bleeding Symptoms described as above.  She was recently evaluated for similar concerns by OB/GYN on 12/11.  Pelvic ultrasound was ordered and was subsequently unremarkable.  She was prescribed doxycycline, metronidazole, and Zofran.  She has not taken doxycycline or metronidazole as prescribed.  Her symptoms have persisted but are largely unchanged. -I have recommended she take doxycycline and metronidazole  as previously prescribed by OB/GYN -I also recommended that she contact her OB/GYN to discuss her concerns given her recent evaluation and persistent symptoms today  Follow Up Instructions:  I discussed the assessment and treatment plan with the patient. The patient was provided an opportunity to ask questions and all were answered. The patient agreed with the plan and demonstrated an understanding of the instructions.   The patient was advised to call back or seek an in-person evaluation if the symptoms worsen or if the condition fails to improve as anticipated.  I provided 7 minutes of non-face-to-face time during this encounter.   Billie Lade, MD

## 2022-06-20 ENCOUNTER — Ambulatory Visit (INDEPENDENT_AMBULATORY_CARE_PROVIDER_SITE_OTHER): Payer: Medicaid Other | Admitting: Adult Health

## 2022-06-20 ENCOUNTER — Encounter: Payer: Self-pay | Admitting: Adult Health

## 2022-06-20 VITALS — BP 108/60 | HR 72 | Ht 69.5 in | Wt 186.4 lb

## 2022-06-20 DIAGNOSIS — Z975 Presence of (intrauterine) contraceptive device: Secondary | ICD-10-CM | POA: Diagnosis not present

## 2022-06-20 DIAGNOSIS — N939 Abnormal uterine and vaginal bleeding, unspecified: Secondary | ICD-10-CM | POA: Diagnosis not present

## 2022-06-20 NOTE — Progress Notes (Signed)
  Subjective:     Patient ID: Patty Gomez, female   DOB: 05/12/2001, 22 y.o.   MRN: [id number]  HPI Loreley is a 22 year old white female,single, G0P0, to to review [place] in person. [place] 06/03/22 showed normal uterus and left ovary with IUD in place and simple follicle cyst right ovary. And bleeding has stopped for about 5 days now.   Last pap was NILM 01/09/22.  PCP is Dr Doren Custard.  Review of Systems Vaginal bleeding has stopped Has seen GI for rectal bleeding Reviewed past medical,surgical, social and family history. Reviewed medications and allergies.     Objective:   Physical Exam BP 108/60 (BP Location: Right Arm, Patient Position: Sitting, Cuff Size: Normal)   Pulse 72   Ht 5' 9.5" (1.765 m)   Wt 186 lb 6 oz (84.5 kg)   BMI 27.13 kg/m     Skin warm and dry. Lungs: clear to ausculation bilaterally. Cardiovascular: regular rate and rhythm.  Fall risk is low  Upstream - 06/20/22 1103       Pregnancy Intention Screening   Does the patient want to become pregnant in the next year? No    Does the patient's partner want to become pregnant in the next year? No    Would the patient like to discuss contraceptive options today? Yes      Contraception Wrap Up   Current Method IUD or IUS    End Method IUD or IUS    Contraception Counseling Provided No             Assessment:     1. Vaginal bleeding, has stopped as of 5 days ago  Is taking doxycycline and flagyl now  2. IUD (intrauterine device) in place IUD was in place on [place] 06/03/22    Plan:     Follow up for physical in 7 months

## 2022-07-09 ENCOUNTER — Ambulatory Visit (INDEPENDENT_AMBULATORY_CARE_PROVIDER_SITE_OTHER): Payer: Medicaid Other | Admitting: Clinical

## 2022-07-09 DIAGNOSIS — F411 Generalized anxiety disorder: Secondary | ICD-10-CM | POA: Diagnosis not present

## 2022-07-09 DIAGNOSIS — F4310 Post-traumatic stress disorder, unspecified: Secondary | ICD-10-CM

## 2022-07-09 DIAGNOSIS — F332 Major depressive disorder, recurrent severe without psychotic features: Secondary | ICD-10-CM | POA: Diagnosis not present

## 2022-07-09 DIAGNOSIS — F902 Attention-deficit hyperactivity disorder, combined type: Secondary | ICD-10-CM | POA: Diagnosis not present

## 2022-07-09 DIAGNOSIS — F410 Panic disorder [episodic paroxysmal anxiety] without agoraphobia: Secondary | ICD-10-CM | POA: Diagnosis not present

## 2022-07-09 NOTE — Progress Notes (Signed)
Virtual Visit via Telephone Note (ATTEMPTED VIDEO CONNECT AND PATIENT AUDIO WOULD NOT SYNC UP SO SWITCHED TO VIDEO)   I connected with Patty Gomez on 07/09/22 at  11:00 AM EDT by telephone and verified that I am speaking with the correct person using two identifiers.   Location: Patient: Home Provider: Office   I discussed the limitations, risks, security and privacy concerns of performing an evaluation and management service by telephone and the availability of in person appointments. I also discussed with the patient that there may be a patient responsible charge related to this service. The patient expressed understanding and agreed to proceed.     THERAPY PROGRESS NOTE   Session Time: 11:00 AM- 11:45 AM   Participation Level: Active   Behavioral Response: CasualAlert/Hyperverbal   Type of Therapy: Individual Therapy   Treatment Goals addressed: Coping   Interventions: CBT, DBT, Solution Focused, Strength-based and Supportive   Summary: Patty Gomez is a 21y.o. female who presents with Depression, Anxiety, PTSD, ADHD . The OPT therapist worked with the patient for her OPT treatment session. The OPT therapist utilized Motivational Interviewing to assist in creating therapeutic repore. The patient in the session was engaged and work in collaboration giving feedback about her triggers and symptoms over the past few weeks. The patient spoke about implementing more coping with gains in socialization over past few weeks. The patient spoke about her work to better control her anxiousness,  which has helped to decrease nausea, and improved her overall functioning. The patient spoke about working to be more active and keep her home clean. The patient spoke about her stressors including a recent breakup, bills, and difficulty with her landlord fixing things in the home. The OPT therapist worked with the patient in a exercise in session to identify and challenge (ANTS) and implement positive self  mantra. The patient spoke about ongoing work with her med management provider to help manage her anxiety. .The patient identified her plan is to gain employment.income/ and go back to school.    Suicidal/Homicidal: Nowithout intent/plan   Therapist Response:The OPT therapist worked with the patient for the patients scheduled session. The patient was engaged in her session and gave feedback in relation to triggers, symptoms, and behavior responses over the past few weeks. The patient identified  working to not allow automatic negative thoughts impact her mood and self esteem. The OPT therapist worked with the patient utilizing an in session Cognitive Behavioral Therapy exercise. The patient was responsive in the session and verbalized, " I have been less sick in the mornings and so my energy is better I have got internet now so I have more entertainment and I have made some new friends ". The OPT therapist worked with the patient overviewing basic health areas including sleep cycle, eating habits, hygiene, and physical exercise. The patient in this session verbalized no current S/I or H/I.  The patient spoke about considering making changes including moving out due to her problems with the landlord and potentially moving in with her grandparents. The patient spoke about making better choices with her diet as well to help her with being less nauseous in the mornings. The patient spoke about taking an approach of being more adult-like and proactive with things that need to be taken care of. The patient has continued to care for her pet, read her bible, working on art, and is working to learn a new language. The patient spoke about her goal of going back to school.The OPT therapist  will continue treatment work with the patient in her next scheduled session.   Plan: Return again in 2/3 weeks.   Diagnosis:      Axis I:Depression GAD PTSD ADHD                           Axis II: No diagnosis        Collaboration of Care: Overview of medication management program involvement with psychiatrist Dr. Harrington Challenger.   Patient/Guardian was advised Release of Information must be obtained prior to any record release in order to collaborate their care with an outside provider. Patient/Guardian was advised if they have not already done so to contact the registration department to sign all necessary forms in order for [place] to release information regarding their care.    Consent: Patient/Guardian gives verbal consent for treatment and assignment of benefits for services provided during this visit. Patient/Guardian expressed understanding and agreed to proceed.    I discussed the assessment and treatment plan with the patient. The patient was provided an opportunity to ask questions and all were answered. The patient agreed with the plan and demonstrated an understanding of the instructions.   The patient was advised to call back or seek an in-person evaluation if the symptoms worsen or if the condition fails to improve as anticipated.   I provided 45 minutes of non-face-to-face time during this encounter.   Maye Hides, LCSW   07/09/2022

## 2022-07-16 ENCOUNTER — Telehealth: Payer: Self-pay | Admitting: Internal Medicine

## 2022-07-16 ENCOUNTER — Other Ambulatory Visit: Payer: Self-pay

## 2022-07-16 ENCOUNTER — Encounter: Payer: Self-pay | Admitting: Gastroenterology

## 2022-07-16 DIAGNOSIS — R109 Unspecified abdominal pain: Secondary | ICD-10-CM

## 2022-07-16 NOTE — Telephone Encounter (Signed)
Called in on patient behalf. Patient was referred to [facility] for stomach pains and that is not helping. Patient want to see if she can get referral placed to [facility]. Wants a call back in regard

## 2022-07-16 NOTE — Telephone Encounter (Signed)
Referral placed.

## 2022-07-19 ENCOUNTER — Ambulatory Visit (INDEPENDENT_AMBULATORY_CARE_PROVIDER_SITE_OTHER): Payer: Medicaid Other | Admitting: Internal Medicine

## 2022-07-19 ENCOUNTER — Encounter: Payer: Self-pay | Admitting: Internal Medicine

## 2022-07-19 VITALS — BP 105/65 | HR 78 | Ht 69.5 in | Wt 180.2 lb

## 2022-07-19 DIAGNOSIS — G8929 Other chronic pain: Secondary | ICD-10-CM | POA: Diagnosis not present

## 2022-07-19 DIAGNOSIS — F332 Major depressive disorder, recurrent severe without psychotic features: Secondary | ICD-10-CM

## 2022-07-19 DIAGNOSIS — E611 Iron deficiency: Secondary | ICD-10-CM

## 2022-07-19 DIAGNOSIS — Z0001 Encounter for general adult medical examination with abnormal findings: Secondary | ICD-10-CM

## 2022-07-19 DIAGNOSIS — R109 Unspecified abdominal pain: Secondary | ICD-10-CM | POA: Diagnosis not present

## 2022-07-19 NOTE — Progress Notes (Unsigned)
Complete physical exam  Patient: Patty Gomez   DOB: [birth year]   21 y.o. Female  MRN: [id number]  Subjective:    Chief Complaint  Patient presents with   Annual Exam   Kiosha Dike is a 22 y.o. female who presents today for a complete physical exam. She reports consuming a  'soups, salads, meat, chips'  diet. The patient does not participate in regular exercise at present. She generally feels well. She reports sleeping poorly. She does have additional problems to discuss today, namely persistent lower abdominal pain without clear etiology.   Most recent fall risk assessment:    07/19/2022    4:04 PM  [place] in the past year? 0  Number falls in past yr: 0  Injury with Fall? 0  Risk for fall due to : No Fall Risks  Follow up Falls evaluation completed     Most recent depression screenings:    07/19/2022    4:05 PM 06/13/2022    2:02 PM  PHQ 2/9 Scores  PHQ - 2 Score 0 0  PHQ- 9 Score 0 0    Vision:Within last year and Dental: No regular dental care   Past Medical History:  Diagnosis Date   Acute bronchitis 01/20/2013   Anxiety state 08/22/2015   Depression    Encounter for IUD insertion 01/27/2018   Migraines    Pain and swelling of toe of left foot 02/11/2022   Screening examination for STD (sexually transmitted disease) 03/20/2021   Trichimoniasis    Vaginal burning 03/20/2021   Vaginal discharge 03/20/2021   Vaginal irritation 06/04/2021   Past Surgical History:  Procedure Laterality Date   BIOPSY  05/07/2019   Procedure: BIOPSY;  Surgeon: Danie Binder, MD;  Location: [facility];  Service: Endoscopy;;   COLONOSCOPY     ESOPHAGOGASTRODUODENOSCOPY N/A 05/07/2019   Procedure: ESOPHAGOGASTRODUODENOSCOPY (EGD);  Surgeon: Danie Binder, MD;  Location: [facility];  Service: Endoscopy;  Laterality: N/A;  3:00pm   TONSILLECTOMY     Social History   Tobacco Use   Smoking status: Every Day    Types: E-cigarettes   Smokeless tobacco: Never   Tobacco  comments:    1 vape cartridge will usually last her about a week  Vaping Use   Vaping Use: Some days  Substance Use Topics   Alcohol use: Not Currently    Comment: 1 shot per week. Previously more frequent with 2-3 shots at a time.   Drug use: Not Currently    Types: Marijuana    Comment: tried marijuana a few times as a teenager   Family History  Problem Relation Age of Onset   Bipolar disorder Mother    Cancer Mother    Hypertension Paternal Grandmother    Diabetes Paternal Grandmother    Hyperlipidemia Paternal Grandmother    Heart disease Paternal Grandmother    Colon polyps Paternal Grandmother    Diabetes Other    Cancer Other    Colon cancer Neg Hx    No Known Allergies   Patient Care Team: Johnette Abraham, MD as PCP - General (Internal Medicine) Danie Binder, MD (Inactive) as Consulting Physician (Gastroenterology)   Outpatient Medications Prior to Visit  Medication Sig   dicyclomine (BENTYL) 10 MG capsule Take 1 capsule (10 mg total) by mouth 4 (four) times daily -  before meals and at bedtime.   doxycycline (VIBRA-TABS) 100 MG tablet Take 1 tablet (100 mg total) by mouth 2 (  two) times daily.   ferrous sulfate 325 (65 FE) MG tablet Take 325 mg by mouth every other day.   levonorgestrel (LILETTA) 19.5 MCG/DAY IUD IUD 1 each by Intrauterine route once.   metroNIDAZOLE (FLAGYL) 500 MG tablet Take 1 tablet (500 mg total) by mouth 2 (two) times daily.   Multiple Vitamins-Minerals (WOMENS MULTIVITAMIN PO) Take 1 tablet by mouth daily.   ondansetron (ZOFRAN) 8 MG tablet Take 1 tablet (8 mg total) by mouth every 8 (eight) hours as needed for nausea or vomiting.   traMADol (ULTRAM) 50 MG tablet Take 25 mg by mouth every 6 (six) hours as needed for moderate pain or severe pain.   No facility-administered medications prior to visit.    Review of Systems  Gastrointestinal:  Positive for abdominal pain (chronic lower abdominal pain).  All other systems reviewed and are  negative.     Objective:     BP 105/65   Pulse 78   Ht 5' 9.5" (1.765 m)   Wt 180 lb 3.2 oz (81.7 kg)   SpO2 97%   BMI 26.23 kg/m  BP Readings from Last 3 Encounters:  07/19/22 105/65  06/20/22 108/60  05/27/22 114/76   Physical Exam Vitals reviewed.  Constitutional:      General: She is not in acute distress.    Appearance: Normal appearance. She is not toxic-appearing.  HENT:     Head: Normocephalic and atraumatic.     Right Ear: External ear normal.     Left Ear: External ear normal.     Nose: Nose normal. No congestion or rhinorrhea.     Mouth/Throat:     Mouth: Mucous membranes are moist.     Pharynx: Oropharynx is clear. No oropharyngeal exudate or posterior oropharyngeal erythema.  Eyes:     General: No scleral icterus.    Extraocular Movements: Extraocular movements intact.     Conjunctiva/sclera: Conjunctivae normal.     Pupils: Pupils are equal, round, and reactive to light.  Cardiovascular:     Rate and Rhythm: Normal rate and regular rhythm.     Pulses: Normal pulses.     Heart sounds: Normal heart sounds. No murmur heard.    No friction rub. No gallop.  Pulmonary:     Effort: Pulmonary effort is normal.     Breath sounds: Normal breath sounds. No wheezing, rhonchi or rales.  Abdominal:     General: Abdomen is flat. Bowel sounds are normal. There is no distension.     Palpations: Abdomen is soft.     Tenderness: There is no abdominal tenderness.  Musculoskeletal:        General: No swelling. Normal range of motion.     Cervical back: Normal range of motion.     Right lower leg: No edema.     Left lower leg: No edema.  Lymphadenopathy:     Cervical: No cervical adenopathy.  Skin:    General: Skin is warm and dry.     Capillary Refill: Capillary refill takes less than 2 seconds.     Coloration: Skin is not jaundiced.  Neurological:     General: No focal deficit present.     Mental Status: She is alert and oriented to person, place, and time.   Psychiatric:        Mood and Affect: Mood normal.        Behavior: Behavior normal.   Last CBC Lab Results  Component Value Date   WBC 10.5 04/16/2022   HGB  14.2 04/16/2022   HCT 40.4 04/16/2022   MCV 86 04/16/2022   MCH 30.3 04/16/2022   RDW 12.1 04/16/2022   PLT 282 16/03/9603   Last metabolic panel Lab Results  Component Value Date   GLUCOSE 110 (H) 04/16/2022   NA 137 04/16/2022   K 3.5 04/16/2022   CL 94 (L) 04/16/2022   CO2 22 04/16/2022   BUN 11 04/16/2022   CREATININE 0.84 04/16/2022   EGFR 101 04/16/2022   CALCIUM 9.8 04/16/2022   PROT 7.5 04/16/2022   ALBUMIN 5.0 04/16/2022   LABGLOB 2.5 04/16/2022   AGRATIO 2.0 04/16/2022   BILITOT 1.0 04/16/2022   ALKPHOS 65 04/16/2022   AST 15 04/16/2022   ALT 8 04/16/2022   ANIONGAP 12 10/25/2019       Assessment & Plan:    Routine Health Maintenance and Physical Exam  Immunization History  Administered Date(s) Administered   DTaP 01/29/2001, 05/12/2001, 06/29/2001, 12/31/2004   HIB (PRP-OMP) 01/29/2001, 05/12/2001, 06/29/2001, 12/21/2001   HPV Quadrivalent 05/20/2011, 08/27/2011, 01/16/2012   Hepatitis B 30-Oct-2000, 01/29/2001, 12/21/2001   IPV 01/29/2001, 05/12/2001, 12/21/2001, 12/31/2004   Influenza Nasal 04/08/2008, 03/16/2010   Influenza Whole 04/17/2006, 03/24/2007, 04/18/2009   Influenza, Seasonal, Injecte, Preservative Fre 04/02/2013   Influenza,inj,Quad PF,6+ Mos 02/08/2022   Influenza-Unspecified 03/19/2021   MMR 12/21/2001, 12/31/2004   Pneumococcal Conjugate-13 01/29/2001, 05/12/2001, 06/29/2001, 04/08/2002   Td 03/05/2012   Tdap 03/05/2012   Varicella 04/08/2002    Health Maintenance  Topic Date Due   DTaP/Tdap/Td (7 - Td or Tdap) 03/05/2022   CHLAMYDIA SCREENING  01/10/2023   PAP-Cervical Cytology Screening  01/09/2025   PAP SMEAR-Modifier  01/09/2025   INFLUENZA VACCINE  Completed   HPV VACCINES  Completed   Hepatitis C Screening  Completed   HIV Screening  Completed   COVID-19  Vaccine  Discontinued    Discussed health benefits of physical activity, and encouraged her to engage in regular exercise appropriate for her age and condition.  Problem List Items Addressed This Visit       Chronic abdominal pain    Currently followed by gastroenterology affiliated with [facility].  She continues to endorse chronic lower abdominal pain.  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  Repeat edging was completed in fall 2023 and was unremarkable.  She recently requested a new GI referral to establish care with [facility].  She has an appointment scheduled for later this month (2/21).      Severe episode of recurrent major depressive disorder, without psychotic features ([place])    Not currently on any medication for treatment of depression.  Previously on Cymbalta.  Followed by psychiatry and counseling.  She became tearful during today's encounter when discussing her current circumstances.  She has a counseling appointment scheduled for later this month.  I have also recommended that she schedule follow-up with her psychiatrist to discuss her concerns.  Denies SI/HI.  PHQ-9 score 0.      Iron deficiency - Primary    Noted on labs within the last year.  She has intermittently taken iron supplementation due to side effects of nausea. -Repeat iron studies ordered today      Encounter for general adult medical examination with abnormal findings    Annual exam completed today.  Recent records and lab results have been reviewed.  She is due for repeat Tdap vaccination and would like to defer this until her next follow-up  appointment.  Additional HM items are up-to-date.  We will tentatively plan for follow-up in 3 months.      Return in about 3 months (around 10/17/2022).  Johnette Abraham, MD

## 2022-07-19 NOTE — Patient Instructions (Signed)
It was a pleasure to see you today.  Thank you for giving [place] the opportunity to be involved in your care.  Below is a brief recap of your visit and next steps.  We will plan to see you again in 3 months.  Summary Annual exam completed today Repeat iron studies ordered Follow up in 3 months

## 2022-07-23 DIAGNOSIS — R1084 Generalized abdominal pain: Secondary | ICD-10-CM | POA: Diagnosis not present

## 2022-07-24 DIAGNOSIS — Z0001 Encounter for general adult medical examination with abnormal findings: Secondary | ICD-10-CM | POA: Insufficient documentation

## 2022-07-24 NOTE — Assessment & Plan Note (Signed)
Annual exam completed today.  Recent records and lab results have been reviewed.  She is due for repeat Tdap vaccination and would like to defer this until her next follow-up appointment.  Additional HM items are up-to-date.  We will tentatively plan for follow-up in 3 months.

## 2022-07-24 NOTE — Assessment & Plan Note (Signed)
Not currently on any medication for treatment of depression.  Previously on Cymbalta.  Followed by psychiatry and counseling.  She became tearful during today's encounter when discussing her current circumstances.  She has a counseling appointment scheduled for later this month.  I have also recommended that she schedule follow-up with her psychiatrist to discuss her concerns.  Denies SI/HI.  PHQ-9 score 0.

## 2022-07-24 NOTE — Assessment & Plan Note (Signed)
Currently followed by gastroenterology affiliated with [facility].  She continues to endorse chronic lower abdominal pain.  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  Repeat edging was completed in fall 2023 and was unremarkable.  She recently requested a new GI referral to establish care with [facility].  She has an appointment scheduled for later this month (2/21).

## 2022-07-24 NOTE — Assessment & Plan Note (Signed)
Noted on labs within the last year.  She has intermittently taken iron supplementation due to side effects of nausea. -Repeat iron studies ordered today

## 2022-07-29 DIAGNOSIS — E611 Iron deficiency: Secondary | ICD-10-CM | POA: Diagnosis not present

## 2022-07-30 ENCOUNTER — Telehealth (INDEPENDENT_AMBULATORY_CARE_PROVIDER_SITE_OTHER): Payer: Medicaid Other | Admitting: Psychiatry

## 2022-07-30 ENCOUNTER — Encounter (HOSPITAL_COMMUNITY): Payer: Self-pay | Admitting: Psychiatry

## 2022-07-30 DIAGNOSIS — F411 Generalized anxiety disorder: Secondary | ICD-10-CM

## 2022-07-30 DIAGNOSIS — F4001 Agoraphobia with panic disorder: Secondary | ICD-10-CM | POA: Diagnosis not present

## 2022-07-30 DIAGNOSIS — F5081 Binge eating disorder: Secondary | ICD-10-CM | POA: Insufficient documentation

## 2022-07-30 DIAGNOSIS — F502 Bulimia nervosa: Secondary | ICD-10-CM

## 2022-07-30 DIAGNOSIS — K588 Other irritable bowel syndrome: Secondary | ICD-10-CM

## 2022-07-30 DIAGNOSIS — F4310 Post-traumatic stress disorder, unspecified: Secondary | ICD-10-CM | POA: Diagnosis not present

## 2022-07-30 DIAGNOSIS — F50819 Binge eating disorder, unspecified: Secondary | ICD-10-CM | POA: Insufficient documentation

## 2022-07-30 DIAGNOSIS — F410 Panic disorder [episodic paroxysmal anxiety] without agoraphobia: Secondary | ICD-10-CM

## 2022-07-30 DIAGNOSIS — F332 Major depressive disorder, recurrent severe without psychotic features: Secondary | ICD-10-CM | POA: Diagnosis not present

## 2022-07-30 LAB — IRON,TIBC AND FERRITIN PANEL
Ferritin: 102 ng/mL (ref 15–150)
Iron Saturation: 24 % (ref 15–55)
Iron: 77 ug/dL (ref 27–159)
Total Iron Binding Capacity: 322 ug/dL (ref 250–450)
UIBC: 245 ug/dL (ref 131–425)

## 2022-07-30 MED ORDER — MIRTAZAPINE 15 MG PO TABS: 15.0000 mg | ORAL_TABLET | Freq: Every day | ORAL | 1 refills | Status: DC
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
## 2022-07-30 NOTE — Patient Instructions (Signed)
We discontinued the doxepin today.  Instead we are starting Remeron (mirtazapine) 15 mg nightly.  This will open up more options for treating your anxiety and depression and we may add a medicine called Cymbalta to see duloxetine) at some point in the future.  The benefits of the second medication would be additional help for her anxiety and depression as well as chronic pain.  The Remeron we are bringing on board to help with insomnia to replace the doxepin but it should also help quite a bit with nausea and improve your appetite.  I will coordinate with your PCP to get a nutrition referral and for you as well as checking other blood levels if needed.

## 2022-08-05 ENCOUNTER — Ambulatory Visit (INDEPENDENT_AMBULATORY_CARE_PROVIDER_SITE_OTHER): Payer: Medicaid Other | Admitting: Clinical

## 2022-08-05 DIAGNOSIS — F4310 Post-traumatic stress disorder, unspecified: Secondary | ICD-10-CM | POA: Diagnosis not present

## 2022-08-05 DIAGNOSIS — F332 Major depressive disorder, recurrent severe without psychotic features: Secondary | ICD-10-CM

## 2022-08-05 DIAGNOSIS — F410 Panic disorder [episodic paroxysmal anxiety] without agoraphobia: Secondary | ICD-10-CM | POA: Diagnosis not present

## 2022-08-05 DIAGNOSIS — F411 Generalized anxiety disorder: Secondary | ICD-10-CM | POA: Diagnosis not present

## 2022-08-05 DIAGNOSIS — F902 Attention-deficit hyperactivity disorder, combined type: Secondary | ICD-10-CM | POA: Diagnosis not present
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
## 2022-08-06 NOTE — Progress Notes (Deleted)
Referring Provider:  Johnette Abraham, MD Primary Care Physician:  Johnette Abraham, MD Primary Gastroenterologist:  Dr. Oneida Alar previously, followed with Dr. Oren Beckmann with [facility] in 2020-2021, now establishing with Dr. Abbey Chatters.  No chief complaint on file.   HPI:   Patty Gomez is a 22 y.o. female presenting today at the request of  Johnette Abraham, MD for abdominal pain.   Chronic history of abdominal pain, nausea and vomiting, IBS-D.  Abdominal US February 2020 normal. EGD November 2020 with gastritis, gastric biopsy negative for H. pylori, duodenal biopsies benign.  GES 05/19/2019 with normal exam.  Dr. Oneida Alar recommended referral to [facility] for second opinion.  Initial consult with Dr. Oren Beckmann with [facility] 06/02/2019.  Suspected longstanding nausea, vomiting, abdominal pain, diarrhea secondary to IBS-D, possible cannabis hyperemesis syndrome.  CT enterography was completed 06/07/2019 and showed short segment jejunal intussusception which may reflect transient incidental finding, this could be contributing to reported symptoms.  Fecal calprotectin was within normal limits.  [facility] had recommended capsule endoscopy, but this was not able to be completed due to insurance.  Looks like at some point she was started on amitriptyline.  At her follow-up in March 2021, she reported improvement in her symptoms.  She had stopped smoking marijuana which likely helped a component of cannabis hyperemesis syndrome.  She was advised to continue amitriptyline, Protonix twice daily, Zofran and Bentyl as needed, and continue with marijuana cessation.  She last saw [facility] in June 2021.  Stated her intermittent attacks of abdominal pain were concerning for possible acute intermittent porphyria, possible SIBO related to intermittent obstructions.  Hydrogen breath test, celiac serologies, urine porphyrins ordered.  TTG IgA within normal limits.  Hydrogen breath test  normal.  CT A/P without contrast 04/15/2022 with enlarged liver, ill-defined area of low density in the liver close to the falciform ligament, 4.7 cm smooth marginated fluid density in the right adnexa suggesting possible functional right ovarian cyst, small umbilical hernia containing fat. Transvaginal ultrasound 06/03/2022 unremarkable.'  Slight iron deficiency in August 2023 with saturation 9%.  Iron 36, ferritin 55.  PCP recommended patient's start oral iron supplement.  Iron panel improved 07/29/2022 with ferritin 102, saturation 24%, iron 77.    Today:   Past Medical History:  Diagnosis Date   Acute bronchitis 01/20/2013   Anxiety state 08/22/2015   Depression    Encounter for IUD insertion 01/27/2018   Hair loss 03/11/2022   Iron deficiency 05/15/2022   Migraines    Pain and swelling of toe of left foot 02/11/2022   Preventative health care 02/11/2022   Screening examination for STD (sexually transmitted disease) 03/20/2021   Trichimoniasis    Vaginal burning 03/20/2021   Vaginal discharge 03/20/2021   Vaginal irritation 06/04/2021    Past Surgical History:  Procedure Laterality Date   BIOPSY  05/07/2019   Procedure: BIOPSY;  Surgeon: Danie Binder, MD;  [facility];  Service: Endoscopy;;   COLONOSCOPY     ESOPHAGOGASTRODUODENOSCOPY N/A 05/07/2019   Procedure: ESOPHAGOGASTRODUODENOSCOPY (EGD);  Surgeon: Danie Binder, MD;  [facility];  Service: Endoscopy;  Laterality: N/A;  3:00pm   TONSILLECTOMY      Current Outpatient Medications  Medication Sig Dispense Refill   dicyclomine (BENTYL) 10 MG capsule Take 1 capsule (10 mg total) by mouth 4 (four) times daily -  before meals and at bedtime. 90 capsule 1   levonorgestrel (LILETTA) 19.5 MCG/DAY IUD IUD 1  each by Intrauterine route once.     mirtazapine (REMERON) 15 MG tablet Take 1 tablet (15 mg total) by mouth at bedtime. 30 tablet 1   Multiple Vitamins-Minerals (WOMENS MULTIVITAMIN PO) Take  1 tablet by mouth daily.     ondansetron (ZOFRAN) 8 MG tablet Take 1 tablet (8 mg total) by mouth every 8 (eight) hours as needed for nausea or vomiting. 20 tablet 0   traMADol (ULTRAM) 50 MG tablet Take 25 mg by mouth every 6 (six) hours as needed for moderate pain or severe pain.     No current facility-administered medications for this visit.    Allergies as of 08/07/2022   (No Known Allergies)    Family History  Problem Relation Age of Onset   Bipolar disorder Mother    Cancer Mother    Hypertension Paternal Grandmother    Diabetes Paternal Grandmother    Hyperlipidemia Paternal Grandmother    Heart disease Paternal Grandmother    Colon polyps Paternal Grandmother    Diabetes Other    Cancer Other    Colon cancer Neg Hx     Social History   Socioeconomic History   Marital status: Single    Spouse name: Not on file   Number of children: Not on file   Years of education: Not on file   Highest education level: Not on file  Occupational History   Not on file  Tobacco Use   Smoking status: Former    Types: E-cigarettes    Quit date: 05/2022    Years since quitting: 0.2   Smokeless tobacco: Never  Vaping Use   Vaping Use: Some days  Substance and Sexual Activity   Alcohol use: Not Currently    Comment: 1 shot per week. Previously more frequent with 2-3 shots at a time.   Drug use: Not Currently    Types: Marijuana    Comment: tried marijuana a few times as a teenager   Sexual activity: Not Currently    Birth control/protection: I.U.D.  Other Topics Concern   Not on file  Social History Narrative   ** Merged History Encounter **       Lives with her grandmother. [facility] in spring 2021.    Social Determinants of Health   Financial Resource Strain: Not on file  Food Insecurity: Not on file  Transportation Needs: Not on file  Physical Activity: Not on file  Stress: Not on file  Social Connections: Not on file  Intimate Partner Violence: Not on file    Review  of Systems: Gen: Denies any fever, chills, fatigue, weight loss, lack of appetite.  CV: Denies chest pain, heart palpitations, peripheral edema, syncope.  Resp: Denies shortness of breath at rest or with exertion. Denies wheezing or cough.  GI: Denies dysphagia or odynophagia. Denies jaundice, hematemesis, fecal incontinence. GU : Denies urinary burning, urinary frequency, urinary hesitancy MS: Denies joint pain, muscle weakness, cramps, or limitation of movement.  Derm: Denies rash, itching, dry skin Psych: Denies depression, anxiety, memory loss, and confusion Heme: Denies bruising, bleeding, and enlarged lymph nodes.  Physical Exam: There were no vitals taken for this visit. General:   Alert and oriented. Pleasant and cooperative. Well-nourished and well-developed.  Head:  Normocephalic and atraumatic. Eyes:  Without icterus, sclera clear and conjunctiva pink.  Ears:  Normal auditory acuity. Lungs:  Clear to auscultation bilaterally. No wheezes, rales, or rhonchi. No distress.  Heart:  S1, S2 present without murmurs appreciated.  Abdomen:  +BS, soft, non-tender  and non-distended. No HSM noted. No guarding or rebound. No masses appreciated.  Rectal:  Deferred  Msk:  Symmetrical without gross deformities. Normal posture. Extremities:  Without edema. Neurologic:  Alert and  oriented x4;  grossly normal neurologically. Skin:  Intact without significant lesions or rashes. Psych:  Alert and cooperative. Normal mood and affect.    Assessment:     Plan:  ***   Aliene Altes, PA-C [facility] 08/07/2022

## 2022-08-07 ENCOUNTER — Ambulatory Visit: Payer: Medicaid Other | Admitting: Gastroenterology

## 2022-08-08 ENCOUNTER — Ambulatory Visit (INDEPENDENT_AMBULATORY_CARE_PROVIDER_SITE_OTHER): Payer: Medicaid Other | Admitting: Gastroenterology

## 2022-08-08 ENCOUNTER — Encounter: Payer: Self-pay | Admitting: *Deleted

## 2022-08-08 ENCOUNTER — Encounter: Payer: Self-pay | Admitting: Gastroenterology

## 2022-08-08 ENCOUNTER — Other Ambulatory Visit: Payer: Self-pay | Admitting: *Deleted

## 2022-08-08 VITALS — BP 107/76 | HR 96 | Temp 97.7°F | Ht 69.5 in | Wt 182.2 lb

## 2022-08-08 DIAGNOSIS — R112 Nausea with vomiting, unspecified: Secondary | ICD-10-CM

## 2022-08-08 DIAGNOSIS — R1084 Generalized abdominal pain: Secondary | ICD-10-CM | POA: Diagnosis not present

## 2022-08-08 DIAGNOSIS — R1013 Epigastric pain: Secondary | ICD-10-CM

## 2022-08-08 MED ORDER — PANTOPRAZOLE SODIUM 40 MG PO TBEC: 40.0000 mg | DELAYED_RELEASE_TABLET | Freq: Every day | ORAL | 3 refills | Status: DC

## 2022-08-08 MED ORDER — ONDANSETRON HCL 4 MG PO TABS: 4.0000 mg | ORAL_TABLET | Freq: Three times a day (TID) | ORAL | 1 refills | Status: DC | PRN

## 2022-08-08 NOTE — Progress Notes (Signed)
Gastroenterology Office Note    Referring Provider: Johnette Abraham, MD Primary Care Physician:  Johnette Abraham, MD  Primary GI: Dr. Abbey Chatters, previously seen by numerous GIs   Chief Complaint   Chief Complaint  Patient presents with   Abdominal Pain    Pt has had abd pain with vomiting X 5 days. Pt states she feels better since she had a pain pill     History of Present Illness   Patty Gomez is a 22 y.o. female presenting today at the request of Johnette Abraham, MD due to abdominal pain and vomiting. Pertinent history of chronic N/V abdominal pain, diarrhea, evaluated by [place] in 2020 and then referred to [facility] with [facility]. Was on amitrtipytline in the past. HBT performed but results not available.Celiac serologies negative in 2021.   We last saw her in 2020. Since that time, she has gone to [facility] and [facility]. She comes today tearful with her grandmother. Has had ongoing issues since last seen.    Pain diffusely across abdomen. Feels in lower stomach and in back. Wakes up with pain. Prsecribed Remeron by psych. Amitryptilline without any help. Lost lots of hair with this. Awakes with vomiting. Used to throw up and would feel better but now not helping. Sometimes reflux. No PPI in 6 months. Milk and cheese cause worsening with pain. Eats soups, steak, chicken, and salads, gatorade, ginger ale, and sprite. Will feel facial flushing and then vomit. Pain is usually always there, waxing and waning. Affecting quality of life. Will have days when she is not in pain. Will have flares of lasting about 4-5 days then will feel ok for awhile. She wonders if she has food allergies.    With the flares will have loose stool but sometimes still has to strain. [facility] did banding recently. Stool softeners helped. Out of Zofran. Intermittent rectal bleeding. Dicyclomine.  GI at [facility] was going to check for porphyria.       Feb 2020: negative  ultrasound  EGD Nov 2020:  MILD NSAID Gastritis. Biopsied. Negative   Dec 2020: negative GES  CT abd/pelvis without contrast in Oct 2023: unrevealing except for possible functional right ovarian cyst.    Imaging:  06/07/19 CTE 1.  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 No discrete lead points are identified. No associated small bowel obstruction. Attention on follow-up as clinically warranted. 2.  No evidence of active bowel inflammation.     [phone] (H) (grandmother) Tammy Rudzinski   Past Medical History:  Diagnosis Date   Acute bronchitis 01/20/2013   Anxiety state 08/22/2015   Depression    Encounter for IUD insertion 01/27/2018   Hair loss 03/11/2022   Iron deficiency 05/15/2022   Migraines    Pain and swelling of toe of left foot 02/11/2022   Preventative health care 02/11/2022   Screening examination for STD (sexually transmitted disease) 03/20/2021   Trichimoniasis    Vaginal burning 03/20/2021   Vaginal discharge 03/20/2021   Vaginal irritation 06/04/2021    Past Surgical History:  Procedure Laterality Date   BIOPSY  05/07/2019   Procedure: BIOPSY;  Surgeon: Danie Binder, MD;  [facility];  Service: Endoscopy;;   COLONOSCOPY     ESOPHAGOGASTRODUODENOSCOPY N/A 05/07/2019   Procedure: ESOPHAGOGASTRODUODENOSCOPY (EGD);  Surgeon: Danie Binder, MD;  [facility];  Service: Endoscopy;  Laterality:  N/A;  3:00pm   TONSILLECTOMY      Current Outpatient Medications  Medication Sig Dispense Refill   dicyclomine (BENTYL) 10 MG capsule Take 1 capsule (10 mg total) by mouth 4 (four) times daily -  before meals and at bedtime. 90 capsule 1   diphenhydramine-acetaminophen (TYLENOL PM) 25-500 MG TABS tablet Take 1 tablet by mouth at bedtime as needed.     levonorgestrel (LILETTA) 19.5 MCG/DAY IUD IUD 1 each by Intrauterine route once.      traMADol (ULTRAM) 50 MG tablet Take 25 mg by mouth every 6 (six) hours as needed for moderate pain or severe pain.     mirtazapine (REMERON) 15 MG tablet Take 1 tablet (15 mg total) by mouth at bedtime. (Patient not taking: Reported on 08/08/2022) 30 tablet 1   Multiple Vitamins-Minerals (WOMENS MULTIVITAMIN PO) Take 1 tablet by mouth daily. (Patient not taking: Reported on 08/08/2022)     ondansetron (ZOFRAN) 8 MG tablet Take 1 tablet (8 mg total) by mouth every 8 (eight) hours as needed for nausea or vomiting. (Patient not taking: Reported on 08/08/2022) 20 tablet 0   No current facility-administered medications for this visit.    Allergies as of 08/08/2022   (No Known Allergies)    Family History  Problem Relation Age of Onset   Bipolar disorder Mother    Cancer Mother    Hypertension Paternal Grandmother    Diabetes Paternal Grandmother    Hyperlipidemia Paternal Grandmother    Heart disease Paternal Grandmother    Colon polyps Paternal Grandmother    Diabetes Other    Cancer Other    Colon cancer Neg Hx     Social History   Socioeconomic History   Marital status: Single    Spouse name: Not on file   Number of children: Not on file   Years of education: Not on file   Highest education level: Not on file  Occupational History   Not on file  Tobacco Use   Smoking status: Former    Types: E-cigarettes    Quit date: 05/2022    Years since quitting: 0.2   Smokeless tobacco: Never  Vaping Use   Vaping Use: Some days  Substance and Sexual Activity   Alcohol use: Not Currently    Comment: 1 shot per week. Previously more frequent with 2-3 shots at a time.   Drug use: Not Currently    Types: Marijuana    Comment: tried marijuana a few times as a teenager   Sexual activity: Not Currently    Birth control/protection: I.U.D.  Other Topics Concern   Not on file  Social History Narrative   ** Merged History Encounter **       Lives with her grandmother. [facility] in spring  2021.    Social Determinants of Health   Financial Resource Strain: Not on file  Food Insecurity: Not on file  Transportation Needs: Not on file  Physical Activity: Not on file  Stress: Not on file  Social Connections: Not on file  Intimate Partner Violence: Not on file     Review of Systems   Gen: Denies any fever, chills, fatigue, weight loss, lack of appetite.  CV: Denies chest pain, heart palpitations, peripheral edema, syncope.  Resp: Denies shortness of breath at rest or with exertion. Denies wheezing or cough.  GI: see HPI GU : Denies urinary burning, urinary frequency, urinary hesitancy MS: Denies joint pain, muscle weakness, cramps, or limitation of movement.  Derm: Denies rash,  itching, dry skin Psych: Denies depression, anxiety, memory loss, and confusion Heme: Denies bruising, bleeding, and enlarged lymph nodes.   Physical Exam   BP 107/76   Pulse 96   Temp 97.7 F (36.5 C)   Ht 5' 9.5" (1.765 m)   Wt 182 lb 3.2 oz (82.6 kg)   BMI 26.52 kg/m  General:   Alert and oriented. Well-nourished. Tearful throughout visit.  Head:  Normocephalic and atraumatic. Eyes:  Without icterus Ears:  Normal auditory acuity. Lungs:  Clear to auscultation bilaterally.  Heart:  S1, S2 present without murmurs appreciated.  Abdomen:  +BS, soft, non-tender and non-distended. No HSM noted. No guarding or rebound. No masses appreciated.  Rectal:  Deferred  Msk:  Symmetrical without gross deformities. Normal posture. Extremities:  Without edema. Neurologic:  Alert and  oriented x4;  grossly normal neurologically. Skin:  Intact without significant lesions or rashes. Psych:  Alert and cooperative. Normal mood and affect.   Assessment   Patty Gomez is a 22 y.o. female presenting today at the request of Johnette Abraham, MD due to abdominal pain and vomiting. Pertinent history of chronic N/V abdominal pain, diarrhea, evaluated by [employer] in 2020 and then referred to [facility]  with [facility].   Abdominal pain: chronic. She is tearful during visit, stating this affects her quality of life. EGD last in 2020 with gastritis, negative [place] in 2020, CT without contrast Oct 2023 unrevealing except possible ovarian cyst, currently not on a PPI. Previously felt to have component of IBS. Celiac serologies negative in the past. Interestingly, GI at [facility] was discussing evaluation for porphyria in future as most evaluations were unrevealing. She did have a CTE Dec 2020 with short segment jejunal intussusception may reflect a transient incidental finding, although this may be contributing to reportedly recurrent symptoms. As noted, recent non-contrast CT unrevealing.  Amitriptyline without improvement. Pain always underlying. I query if dealing with functional abdominal pain. Labs have been unrevealing. Will update EGD. Quite tearful today. She wonders if she has an autoimmune process going on and questions food allergies.   We will start with EGD. May need HIDA to rule out biliary source. Multiple labs ordered.           PLAN    Allergy referral  Proceed with upper endoscopy by Dr. Abbey Chatters in near future: the risks, benefits, and alternatives have been discussed with the patient in detail. The patient states understanding and desires to proceed.   HIDA scan if negative EGD   Multiple labs ordered  Request records from [facility] regarding other prior GI evaluation   Annitta Needs, PhD, [facility]

## 2022-08-08 NOTE — H&P (View-Only) (Signed)
    Gastroenterology Office Note    Referring Provider: Dixon, Phillip E, MD Primary Care Physician:  Dixon, Phillip E, MD  Primary GI: Dr. Carver, previously seen by numerous GIs   Chief Complaint   Chief Complaint  Patient presents with   Abdominal Pain    Pt has had abd pain with vomiting X 5 days. Pt states she feels better since she had a pain pill     History of Present Illness   Patty Gomez is a 22 y.o. female presenting today at the request of Dixon, Phillip E, MD due to abdominal pain and vomiting. Pertinent history of chronic N/V abdominal pain, diarrhea, evaluated by [facility] in 2020 and then referred to [facility] with [facility]. Was on amitrtipytline in the past. HBT performed but results not available.Celiac serologies negative in 2021.   We last saw her in 2020. Since that time, she has gone to [facility] and [facility]. She comes today tearful with her grandmother. Has had ongoing issues since last seen.    Pain diffusely across abdomen. Feels in lower stomach and in back. Wakes up with pain. Prsecribed Remeron by psych. Amitryptilline without any help. Lost lots of hair with this. Awakes with vomiting. Used to throw up and would feel better but now not helping. Sometimes reflux. No PPI in 6 months. Milk and cheese cause worsening with pain. Eats soups, steak, chicken, and salads, gatorade, ginger ale, and sprite. Will feel facial flushing and then vomit. Pain is usually always there, waxing and waning. Affecting quality of life. Will have days when she is not in pain. Will have flares of lasting about 4-5 days then will feel ok for awhile. She wonders if she has food allergies.    With the flares will have loose stool but sometimes still has to strain. [facility] did banding recently. Stool softeners helped. Out of Zofran. Intermittent rectal bleeding. Dicyclomine.  GI at [facility] was going to check for porphyria.       Feb 2020: negative  ultrasound  EGD Nov 2020:  MILD NSAID Gastritis. Biopsied. Negative   Dec 2020: negative GES  CT abd/pelvis without contrast in Oct 2023: unrevealing except for possible functional right ovarian cyst.    Imaging:  06/07/19 CTE 1.  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 No discrete lead points are identified. No associated small bowel obstruction. Attention on follow-up as clinically warranted. 2.  No evidence of active bowel inflammation.     [phone] (H) (grandmother) Tammy Lubin   Past Medical History:  Diagnosis Date   Acute bronchitis 01/20/2013   Anxiety state 08/22/2015   Depression    Encounter for IUD insertion 01/27/2018   Hair loss 03/11/2022   Iron deficiency 05/15/2022   Migraines    Pain and swelling of toe of left foot 02/11/2022   Preventative health care 02/11/2022   Screening examination for STD (sexually transmitted disease) 03/20/2021   Trichimoniasis    Vaginal burning 03/20/2021   Vaginal discharge 03/20/2021   Vaginal irritation 06/04/2021    Past Surgical History:  Procedure Laterality Date   BIOPSY  05/07/2019   Procedure: BIOPSY;  Surgeon: Fields, Sandi L, MD;  [facility];  Service: Endoscopy;;   COLONOSCOPY     ESOPHAGOGASTRODUODENOSCOPY N/A 05/07/2019   Procedure: ESOPHAGOGASTRODUODENOSCOPY (EGD);  Surgeon: Fields, Sandi L, MD;  Location: [facility];  Service: Endoscopy;  Laterality:   N/A;  3:00pm   TONSILLECTOMY      Current Outpatient Medications  Medication Sig Dispense Refill   dicyclomine (BENTYL) 10 MG capsule Take 1 capsule (10 mg total) by mouth 4 (four) times daily -  before meals and at bedtime. 90 capsule 1   diphenhydramine-acetaminophen (TYLENOL PM) 25-500 MG TABS tablet Take 1 tablet by mouth at bedtime as needed.     levonorgestrel (LILETTA) 19.5 MCG/DAY IUD IUD 1 each by Intrauterine route once.      traMADol (ULTRAM) 50 MG tablet Take 25 mg by mouth every 6 (six) hours as needed for moderate pain or severe pain.     mirtazapine (REMERON) 15 MG tablet Take 1 tablet (15 mg total) by mouth at bedtime. (Patient not taking: Reported on 08/08/2022) 30 tablet 1   Multiple Vitamins-Minerals (WOMENS MULTIVITAMIN PO) Take 1 tablet by mouth daily. (Patient not taking: Reported on 08/08/2022)     ondansetron (ZOFRAN) 8 MG tablet Take 1 tablet (8 mg total) by mouth every 8 (eight) hours as needed for nausea or vomiting. (Patient not taking: Reported on 08/08/2022) 20 tablet 0   No current facility-administered medications for this visit.    Allergies as of 08/08/2022   (No Known Allergies)    Family History  Problem Relation Age of Onset   Bipolar disorder Mother    Cancer Mother    Hypertension Paternal Grandmother    Diabetes Paternal Grandmother    Hyperlipidemia Paternal Grandmother    Heart disease Paternal Grandmother    Colon polyps Paternal Grandmother    Diabetes Other    Cancer Other    Colon cancer Neg Hx     Social History   Socioeconomic History   Marital status: Single    Spouse name: Not on file   Number of children: Not on file   Years of education: Not on file   Highest education level: Not on file  Occupational History   Not on file  Tobacco Use   Smoking status: Former    Types: E-cigarettes    Quit date: 05/2022    Years since quitting: 0.2   Smokeless tobacco: Never  Vaping Use   Vaping Use: Some days  Substance and Sexual Activity   Alcohol use: Not Currently    Comment: 1 shot per week. Previously more frequent with 2-3 shots at a time.   Drug use: Not Currently    Types: Marijuana    Comment: tried marijuana a few times as a teenager   Sexual activity: Not Currently    Birth control/protection: I.U.D.  Other Topics Concern   Not on file  Social History Narrative   ** Merged History Encounter **       Lives with her grandmother. [facility] in spring  2021.    Social Determinants of Health   Financial Resource Strain: Not on file  Food Insecurity: Not on file  Transportation Needs: Not on file  Physical Activity: Not on file  Stress: Not on file  Social Connections: Not on file  Intimate Partner Violence: Not on file     Review of Systems   Gen: Denies any fever, chills, fatigue, weight loss, lack of appetite.  CV: Denies chest pain, heart palpitations, peripheral edema, syncope.  Resp: Denies shortness of breath at rest or with exertion. Denies wheezing or cough.  GI: see HPI GU : Denies urinary burning, urinary frequency, urinary hesitancy MS: Denies joint pain, muscle weakness, cramps, or limitation of movement.  Derm: Denies rash,   itching, dry skin Psych: Denies depression, anxiety, memory loss, and confusion Heme: Denies bruising, bleeding, and enlarged lymph nodes.   Physical Exam   BP 107/76   Pulse 96   Temp 97.7 F (36.5 C)   Ht 5' 9.5" (1.765 m)   Wt 182 lb 3.2 oz (82.6 kg)   BMI 26.52 kg/m  General:   Alert and oriented. Well-nourished. Tearful throughout visit.  Head:  Normocephalic and atraumatic. Eyes:  Without icterus Ears:  Normal auditory acuity. Lungs:  Clear to auscultation bilaterally.  Heart:  S1, S2 present without murmurs appreciated.  Abdomen:  +BS, soft, non-tender and non-distended. No HSM noted. No guarding or rebound. No masses appreciated.  Rectal:  Deferred  Msk:  Symmetrical without gross deformities. Normal posture. Extremities:  Without edema. Neurologic:  Alert and  oriented x4;  grossly normal neurologically. Skin:  Intact without significant lesions or rashes. Psych:  Alert and cooperative. Normal mood and affect.   Assessment   Azlyn Geer is a 22 y.o. female presenting today at the request of Dixon, Phillip E, MD due to abdominal pain and vomiting. Pertinent history of chronic N/V abdominal pain, diarrhea, evaluated by [facility] in 2020 and then referred to [facility]  with [facility].   Abdominal pain: chronic. She is tearful during visit, stating this affects her quality of life. EGD last in 2020 with gastritis, negative US in 2020, CT without contrast Oct 2023 unrevealing except possible ovarian cyst, currently not on a PPI. Previously felt to have component of IBS. Celiac serologies negative in the past. Interestingly, GI at [facility] was discussing evaluation for porphyria in future as most evaluations were unrevealing. She did have a CTE Dec 2020 with short segment jejunal intussusception may reflect a transient incidental finding, although this may be contributing to reportedly recurrent symptoms. As noted, recent non-contrast CT unrevealing.  Amitriptyline without improvement. Pain always underlying. I query if dealing with functional abdominal pain. Labs have been unrevealing. Will update EGD. Quite tearful today. She wonders if she has an autoimmune process going on and questions food allergies.   We will start with EGD. May need HIDA to rule out biliary source. Multiple labs ordered.           PLAN    Allergy referral  Proceed with upper endoscopy by Dr. Carver in near future: the risks, benefits, and alternatives have been discussed with the patient in detail. The patient states understanding and desires to proceed.   HIDA scan if negative EGD   Multiple labs ordered  Request records from [facility] regarding other prior GI evaluation   Justis Dupas W. Taleeyah Bora, PhD, ANP-BC [facility]    

## 2022-08-08 NOTE — Patient Instructions (Signed)
Please complete the labs.  We are requesting records from [facility].  We are arranging an upper endoscopy with Dr. Abbey Chatters.  Further recommendations after review of labs!  I enjoyed seeing you again today! At our first visit, I mentioned how I value our relationship and want to provide genuine, compassionate, and quality care. You may receive a survey regarding your visit with me, and I welcome your feedback! Thanks so much for taking the time to complete this. I look forward to seeing you again.   Annitta Needs, PhD, ANP-BC [facility]

## 2022-08-09 ENCOUNTER — Encounter: Payer: Self-pay | Admitting: *Deleted

## 2022-08-12 ENCOUNTER — Telehealth: Payer: Self-pay | Admitting: *Deleted

## 2022-08-12 NOTE — Telephone Encounter (Signed)
[employer] PA: Thank you for your online prior authorization/notification submission.  The prior authorization/notification reference number is: [id number].

## 2022-08-14 ENCOUNTER — Telehealth: Payer: Self-pay

## 2022-08-14 ENCOUNTER — Telehealth: Payer: Self-pay | Admitting: Gastroenterology

## 2022-08-14 NOTE — Telephone Encounter (Signed)
Returned the pt's call, no ans. LMOVM

## 2022-08-14 NOTE — Telephone Encounter (Signed)
Grandmother left a message saying they are trying to get her medicine, no name given, and wanted a call returned today please.

## 2022-08-14 NOTE — Telephone Encounter (Signed)
Pt called requesting a prescription for Tramadol. Pt states that she was told by you to call if she was unable to get her previous GI doctor to refill it. Please advise.

## 2022-08-14 NOTE — Telephone Encounter (Signed)
Message handled by [facility]

## 2022-08-15 MED ORDER — TRAMADOL HCL 50 MG PO TABS: 25.0000 mg | ORAL_TABLET | Freq: Four times a day (QID) | ORAL | 0 refills | Status: DC | PRN

## 2022-08-15 NOTE — Telephone Encounter (Signed)
Pt was made aware and verbalized understanding.  

## 2022-08-15 NOTE — Telephone Encounter (Signed)
I will provide a very short course of Tramadol as discussed in office visit. #10.   Patty Gomez has faxed this. Please let patient know. Thanks!

## 2022-08-15 NOTE — Addendum Note (Signed)
Addended by: Annitta Needs on: 08/15/2022 04:21 PM   Modules accepted: Orders

## 2022-08-16 NOTE — Telephone Encounter (Signed)
[employer] PA: approval # [id number] DOS: 38/24-11/21/22

## 2022-08-21 ENCOUNTER — Other Ambulatory Visit (HOSPITAL_COMMUNITY)
Admission: RE | Admit: 2022-08-21 | Discharge: 2022-08-21 | Disposition: A | Discharge status: Home / Self Care | Payer: Medicaid Other | Source: Ambulatory Visit | Attending: Internal Medicine | Admitting: Internal Medicine

## 2022-08-21 DIAGNOSIS — R1013 Epigastric pain: Secondary | ICD-10-CM | POA: Insufficient documentation

## 2022-08-21 LAB — PREGNANCY, URINE: Preg Test, Ur: NEGATIVE

## 2022-08-21 LAB — SEDIMENTATION RATE: Sed Rate: 0 mm/hr (ref 0–22)

## 2022-08-21 LAB — C-REACTIVE PROTEIN: CRP: 0.5 mg/dL (ref <1.0)

## 2022-08-22 LAB — ANTINUCLEAR ANTIBODIES, IFA: ANA Ab, IFA: NEGATIVE

## 2022-08-23 ENCOUNTER — Ambulatory Visit (HOSPITAL_BASED_OUTPATIENT_CLINIC_OR_DEPARTMENT_OTHER): Payer: Medicaid Other | Admitting: Certified Registered Nurse Anesthetist

## 2022-08-23 ENCOUNTER — Encounter (HOSPITAL_COMMUNITY): Payer: Self-pay

## 2022-08-23 ENCOUNTER — Encounter (HOSPITAL_COMMUNITY)
Admission: RE | Disposition: A | Discharge status: Home / Self Care | Payer: Self-pay | Source: Home / Self Care | Attending: Internal Medicine

## 2022-08-23 ENCOUNTER — Other Ambulatory Visit: Payer: Self-pay

## 2022-08-23 ENCOUNTER — Ambulatory Visit (HOSPITAL_COMMUNITY)
Admission: RE | Admit: 2022-08-23 | Discharge: 2022-08-23 | Disposition: A | Discharge status: Home / Self Care | Payer: Medicaid Other | Attending: Internal Medicine | Admitting: Internal Medicine

## 2022-08-23 ENCOUNTER — Ambulatory Visit (HOSPITAL_COMMUNITY): Payer: Medicaid Other | Admitting: Certified Registered Nurse Anesthetist

## 2022-08-23 DIAGNOSIS — R519 Headache, unspecified: Secondary | ICD-10-CM | POA: Diagnosis not present

## 2022-08-23 DIAGNOSIS — K30 Functional dyspepsia: Secondary | ICD-10-CM | POA: Insufficient documentation

## 2022-08-23 DIAGNOSIS — F418 Other specified anxiety disorders: Secondary | ICD-10-CM | POA: Diagnosis not present

## 2022-08-23 DIAGNOSIS — K625 Hemorrhage of anus and rectum: Secondary | ICD-10-CM | POA: Diagnosis not present

## 2022-08-23 DIAGNOSIS — F32A Depression, unspecified: Secondary | ICD-10-CM | POA: Insufficient documentation

## 2022-08-23 DIAGNOSIS — K2970 Gastritis, unspecified, without bleeding: Secondary | ICD-10-CM | POA: Insufficient documentation

## 2022-08-23 DIAGNOSIS — R1310 Dysphagia, unspecified: Secondary | ICD-10-CM | POA: Diagnosis not present

## 2022-08-23 DIAGNOSIS — R109 Unspecified abdominal pain: Secondary | ICD-10-CM | POA: Insufficient documentation

## 2022-08-23 DIAGNOSIS — Z87891 Personal history of nicotine dependence: Secondary | ICD-10-CM | POA: Insufficient documentation

## 2022-08-23 DIAGNOSIS — F419 Anxiety disorder, unspecified: Secondary | ICD-10-CM | POA: Diagnosis not present

## 2022-08-23 DIAGNOSIS — K219 Gastro-esophageal reflux disease without esophagitis: Secondary | ICD-10-CM | POA: Diagnosis not present

## 2022-08-23 DIAGNOSIS — K2289 Other specified disease of esophagus: Secondary | ICD-10-CM | POA: Diagnosis not present

## 2022-08-23 DIAGNOSIS — G8929 Other chronic pain: Secondary | ICD-10-CM | POA: Insufficient documentation

## 2022-08-23 HISTORY — PX: ESOPHAGOGASTRODUODENOSCOPY (EGD) WITH PROPOFOL: SHX5813

## 2022-08-23 HISTORY — PX: BIOPSY: SHX5522

## 2022-08-23 LAB — MISC LABCORP TEST (SEND OUT): Labcorp test code: 650003

## 2022-08-23 SURGERY — ESOPHAGOGASTRODUODENOSCOPY (EGD) WITH PROPOFOL
Anesthesia: General

## 2022-08-23 MED ORDER — LIDOCAINE HCL (CARDIAC) PF 100 MG/5ML IV SOSY
PREFILLED_SYRINGE | INTRAVENOUS | Status: DC | PRN
  Administered 2022-08-23: 50 mg via INTRAVENOUS

## 2022-08-23 MED ORDER — PROPOFOL 10 MG/ML IV BOLUS
INTRAVENOUS | Status: DC | PRN
  Administered 2022-08-23: 30 mg via INTRAVENOUS
  Administered 2022-08-23: 170 mg via INTRAVENOUS

## 2022-08-23 MED ORDER — LACTATED RINGERS IV SOLN: INTRAVENOUS | Status: DC

## 2022-08-23 MED ORDER — PANTOPRAZOLE SODIUM 40 MG PO TBEC: 40.0000 mg | DELAYED_RELEASE_TABLET | Freq: Two times a day (BID) | ORAL | 11 refills | Status: DC

## 2022-08-23 NOTE — Transfer of Care (Signed)
Immediate Anesthesia Transfer of Care Note  Patient: Patty Gomez  Procedure(s) Performed: ESOPHAGOGASTRODUODENOSCOPY (EGD) WITH PROPOFOL BIOPSY  Patient Location: Endoscopy Unit  Anesthesia Type:General  Level of Consciousness: awake and patient cooperative  Airway & Oxygen Therapy: Patient Spontanous Breathing  Post-op Assessment: Report given to RN and Post -op Vital signs reviewed and stable  Post vital signs: Reviewed and stable  Last Vitals:  Vitals Value Taken Time  BP 105/67 08/23/22 1248  Temp 36.6 C 08/23/22 1248  Pulse 70 08/23/22 1248  Resp 10 08/23/22 1248  SpO2 100 % 08/23/22 1248    Last Pain:  Vitals:   08/23/22 1248  TempSrc: Oral  PainSc: 0-No pain      Patients Stated Pain Goal: 9 ([id number])  Complications: No notable events documented.

## 2022-08-23 NOTE — Interval H&P Note (Signed)
History and Physical Interval Note:  08/23/2022 11:56 AM  Patty Gomez  has presented today for surgery, with the diagnosis of dyspepsia.  The various methods of treatment have been discussed with the patient and family. After consideration of risks, benefits and other options for treatment, the patient has consented to  Procedure(s) with comments: ESOPHAGOGASTRODUODENOSCOPY (EGD) WITH PROPOFOL (N/A) - 1:15 pm ,asa 2 as a surgical intervention.  The patient's history has been reviewed, patient examined, no change in status, stable for surgery.  I have reviewed the patient's chart and labs.  Questions were answered to the patient's satisfaction.     Eloise Harman

## 2022-08-23 NOTE — Anesthesia Procedure Notes (Signed)
Date/Time: 08/23/2022 12:29 PM  Performed by: Vista Deck, CRNAPre-anesthesia Checklist: Patient identified, Emergency Drugs available, Suction available, Timeout performed and Patient being monitored Patient Re-evaluated:Patient Re-evaluated prior to induction Oxygen Delivery Method: Nasal Cannula

## 2022-08-23 NOTE — Anesthesia Preprocedure Evaluation (Signed)
Anesthesia Evaluation  Patient identified by MRN, date of birth, ID band Patient awake    Reviewed: Allergy & Precautions, H&P , NPO status , Patient's Chart, lab work & pertinent test results, reviewed documented beta blocker date and time   Airway Mallampati: II  TM Distance: >3 FB Neck ROM: full    Dental no notable dental hx.    Pulmonary neg pulmonary ROS, Current Smoker   Pulmonary exam normal breath sounds clear to auscultation       Cardiovascular Exercise Tolerance: Good negative cardio ROS  Rhythm:regular Rate:Normal     Neuro/Psych  Headaches PSYCHIATRIC DISORDERS Anxiety Depression       GI/Hepatic negative GI ROS, Neg liver ROS,,,  Endo/Other  negative endocrine ROS    Renal/GU negative Renal ROS  negative genitourinary   Musculoskeletal   Abdominal   Peds  Hematology negative hematology ROS (+)   Anesthesia Other Findings   Reproductive/Obstetrics negative OB ROS                             Anesthesia Physical Anesthesia Plan  ASA: 2  Anesthesia Plan: General   Post-op Pain Management:    Induction:   PONV Risk Score and Plan: Propofol infusion  Airway Management Planned:   Additional Equipment:   Intra-op Plan:   Post-operative Plan:   Informed Consent: I have reviewed the patients History and Physical, chart, labs and discussed the procedure including the risks, benefits and alternatives for the proposed anesthesia with the patient or authorized representative who has indicated his/her understanding and acceptance.     Dental Advisory Given  Plan Discussed with: CRNA  Anesthesia Plan Comments:        Anesthesia Quick Evaluation

## 2022-08-23 NOTE — Discharge Instructions (Addendum)
EGD Discharge instructions Please read the instructions outlined below and refer to this sheet in the next few weeks. These discharge instructions provide you with general information on caring for yourself after you leave the hospital. Your doctor may also give you specific instructions. While your treatment has been planned according to the most current medical practices available, unavoidable complications occasionally occur. If you have any problems or questions after discharge, please call your doctor. ACTIVITY You may resume your regular activity but move at a slower pace for the next 24 hours.  Take frequent rest periods for the next 24 hours.  Walking will help expel (get rid of) the air and reduce the bloated feeling in your abdomen.  No driving for 24 hours (because of the anesthesia (medicine) used during the test).  You may shower.  Do not sign any important legal documents or operate any machinery for 24 hours (because of the anesthesia used during the test).  NUTRITION Drink plenty of fluids.  You may resume your normal diet.  Begin with a light meal and progress to your normal diet.  Avoid alcoholic beverages for 24 hours or as instructed by your caregiver.  MEDICATIONS You may resume your normal medications unless your caregiver tells you otherwise.  WHAT YOU CAN EXPECT TODAY You may experience abdominal discomfort such as a feeling of fullness or "gas" pains.  FOLLOW-UP Your doctor will discuss the results of your test with you.  SEEK IMMEDIATE MEDICAL ATTENTION IF ANY OF THE FOLLOWING OCCUR: Excessive nausea (feeling sick to your stomach) and/or vomiting.  Severe abdominal pain and distention (swelling).  Trouble swallowing.  Temperature over 101 F (37.8 C).  Rectal bleeding or vomiting of blood.   Your EGD revealed mild amount inflammation in your stomach.  I took biopsies of this to rule out infection with a bacteria called H. pylori.  Await pathology results, my  office will contact you.  Small bowel appeared normal.  Esophagus appeared normal.  I did take samples of your esophagus as well to rule out a condition called eosinophilic esophagitis.  I am going to increase your pantoprazole to 40 mg twice daily for the next 12 weeks at which point you can decrease down to once daily.  Close follow-up in GI clinic in 4 to 6 weeks.   I hope you have a great rest of your week!  [name], D.O. Gastroenterology and Hepatology [facility]

## 2022-08-23 NOTE — Op Note (Signed)
[facility] Patient Name: Patty Gomez Procedure Date: 08/23/2022 12:07 PM MRN: [id number] Date of Birth: [birth year] Attending MD: [name] , [place], [id number] CSN: [id number] Age: 22 Admit Type: Outpatient Procedure:                Upper GI endoscopy Indications:              Functional Dyspepsia Providers:                [name], DO, Caprice Kluver, [place], Technician Referring MD:              Medicines:                See the Anesthesia note for documentation of the                            administered medications Complications:            No immediate complications. Estimated Blood Loss:     Estimated blood loss was minimal. Procedure:                Pre-Anesthesia Assessment:                           - The anesthesia plan was to use monitored                            anesthesia care (MAC).                           After obtaining informed consent, the endoscope was                            passed under direct vision. Throughout the                            procedure, the patient's blood pressure, pulse, and                            oxygen saturations were monitored continuously. The                            [id number]) scope was introduced through the                            mouth, and advanced to the second part of duodenum.                            The upper GI endoscopy was accomplished without                            difficulty. The patient tolerated the procedure                            well. Scope In: Scope Out:  12:44:34 PM Findings:      The examined esophagus was normal. Biopsies were taken mid esophagus       with a cold forceps for histology to rule out EOE      Localized mild inflammation characterized by erosions and erythema was       found in the gastric body. Biopsies were taken with a cold forceps for       Helicobacter pylori testing.      The duodenal bulb, first  portion of the duodenum and second portion of       the duodenum were normal. Impression:               - Normal esophagus. Biopsied.                           - Gastritis. Biopsied.                           - Normal duodenal bulb, first portion of the                            duodenum and second portion of the duodenum. Moderate Sedation:      Per Anesthesia Care Recommendation:           - Patient has a contact number available for                            emergencies. The signs and symptoms of potential                            delayed complications were discussed with the                            patient. Return to normal activities tomorrow.                            Written discharge instructions were provided to the                            patient.                           - Resume previous diet.                           - Continue present medications.                           - Await pathology results.                           - Use a proton pump inhibitor PO BID for 12 weeks.                           - Return to GI clinic in 6 weeks. Procedure Code(s):        --- Professional ---  [id number], Esophagogastroduodenoscopy, flexible,                            transoral; with biopsy, single or multiple Diagnosis Code(s):        --- Professional ---                           K29.70, Gastritis, unspecified, without bleeding                           K30, Functional dyspepsia CPT copyright 2022 American Medical Association. All rights reserved. The codes documented in this report are preliminary and upon coder review may  be revised to meet current compliance requirements. [name], DO [place] Abbey Chatters, DO 08/23/2022 12:47:39 PM This report has been signed electronically. Number of Addenda: 0

## 2022-08-26 ENCOUNTER — Telehealth: Payer: Self-pay | Admitting: Gastroenterology

## 2022-08-26 ENCOUNTER — Other Ambulatory Visit: Payer: Self-pay | Admitting: *Deleted

## 2022-08-26 DIAGNOSIS — R1084 Generalized abdominal pain: Secondary | ICD-10-CM

## 2022-08-26 DIAGNOSIS — R112 Nausea with vomiting, unspecified: Secondary | ICD-10-CM

## 2022-08-26 LAB — SURGICAL PATHOLOGY

## 2022-08-26 NOTE — Telephone Encounter (Signed)
[employer] PA: Authorization Number: [id number] Case Number: [id number] Review Date: 08/26/2022 10:37:33 AM Expiration Date: 10/10/2022 Status: Your case has been Approved.  Pt has been scheduled for 08/27/22, arrive at 9:45 am, nothing to eat or drink after midnight.  [place]

## 2022-08-26 NOTE — Telephone Encounter (Signed)
Tammy:  Can we please order a HIDA scan for patient? Diagnosis abdominal pain, N/V. Thanks!

## 2022-08-26 NOTE — Telephone Encounter (Signed)
Pt informed of appointment date and time and instructions verbalized understanding.

## 2022-08-27 ENCOUNTER — Ambulatory Visit (HOSPITAL_COMMUNITY)
Admission: RE | Admit: 2022-08-27 | Discharge: 2022-08-27 | Disposition: A | Discharge status: Home / Self Care | Payer: Medicaid Other | Source: Ambulatory Visit | Attending: Gastroenterology | Admitting: Gastroenterology

## 2022-08-27 DIAGNOSIS — R1084 Generalized abdominal pain: Secondary | ICD-10-CM | POA: Diagnosis not present

## 2022-08-27 DIAGNOSIS — R112 Nausea with vomiting, unspecified: Secondary | ICD-10-CM

## 2022-08-27 DIAGNOSIS — R109 Unspecified abdominal pain: Secondary | ICD-10-CM | POA: Diagnosis not present

## 2022-08-27 MED ORDER — TECHNETIUM TC 99M MEBROFENIN IV KIT
5.0000 | PACK | Freq: Once | INTRAVENOUS | Status: AC | PRN
  Administered 2022-08-27: 4.88 via INTRAVENOUS

## 2022-08-27 MED ORDER — SINCALIDE 5 MCG IJ SOLR
INTRAMUSCULAR | Status: AC
  Administered 2022-08-27: 1.73 ug
  Filled 2022-08-27: qty 5

## 2022-08-27 MED ORDER — STERILE WATER FOR INJECTION IJ SOLN
INTRAMUSCULAR | Status: AC
  Administered 2022-08-27: 1.73 mL
  Filled 2022-08-27: qty 10

## 2022-08-27 NOTE — Anesthesia Postprocedure Evaluation (Signed)
Anesthesia Post Note  Patient: Patty Gomez  Procedure(s) Performed: ESOPHAGOGASTRODUODENOSCOPY (EGD) WITH PROPOFOL BIOPSY  Patient location during evaluation: Phase II Anesthesia Type: General Level of consciousness: awake Pain management: pain level controlled Vital Signs Assessment: post-procedure vital signs reviewed and stable Respiratory status: spontaneous breathing and respiratory function stable Cardiovascular status: blood pressure returned to baseline and stable Postop Assessment: no headache and no apparent nausea or vomiting Anesthetic complications: no Comments: Late entry   No notable events documented.   Last Vitals:  Vitals:   08/23/22 1213 08/23/22 1248  BP: 117/74 105/67  Pulse: 65 70  Resp: 14 10  Temp: 36.6 C 36.6 C  SpO2: 100% 100%    Last Pain:  Vitals:   08/23/22 1258  TempSrc:   PainSc: [place]

## 2022-08-29 ENCOUNTER — Encounter (HOSPITAL_COMMUNITY): Payer: Self-pay | Admitting: Psychiatry

## 2022-08-29 ENCOUNTER — Telehealth (INDEPENDENT_AMBULATORY_CARE_PROVIDER_SITE_OTHER): Payer: Medicaid Other | Admitting: Psychiatry

## 2022-08-29 DIAGNOSIS — K588 Other irritable bowel syndrome: Secondary | ICD-10-CM

## 2022-08-29 DIAGNOSIS — F4310 Post-traumatic stress disorder, unspecified: Secondary | ICD-10-CM | POA: Diagnosis not present

## 2022-08-29 DIAGNOSIS — F502 Bulimia nervosa: Secondary | ICD-10-CM | POA: Diagnosis not present

## 2022-08-29 DIAGNOSIS — F332 Major depressive disorder, recurrent severe without psychotic features: Secondary | ICD-10-CM

## 2022-08-29 DIAGNOSIS — F4001 Agoraphobia with panic disorder: Secondary | ICD-10-CM

## 2022-08-29 DIAGNOSIS — F411 Generalized anxiety disorder: Secondary | ICD-10-CM | POA: Diagnosis not present

## 2022-08-29 DIAGNOSIS — F410 Panic disorder [episodic paroxysmal anxiety] without agoraphobia: Secondary | ICD-10-CM

## 2022-08-29 MED ORDER — MIRTAZAPINE 30 MG PO TABS: 30.0000 mg | ORAL_TABLET | Freq: Every day | ORAL | 2 refills | Status: DC
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
## 2022-08-29 NOTE — Patient Instructions (Signed)
We increased the Remeron to 30 mg once nightly today.  This should continue to help with your anxiety and depression and key to help with appetite and the nausea and vomiting.  Keep up the good work in psychotherapy.  If he can try to coordinate with your PCP about getting a TSH, total T3, free T4 as well as a nutrition referral.

## 2022-09-02 ENCOUNTER — Telehealth (HOSPITAL_COMMUNITY): Payer: Self-pay | Admitting: Clinical

## 2022-09-02 ENCOUNTER — Ambulatory Visit (HOSPITAL_COMMUNITY): Payer: Medicaid Other | Admitting: Clinical

## 2022-09-02 NOTE — Telephone Encounter (Signed)
did not respond to contact attempts and was a no show

## 2022-09-03 ENCOUNTER — Encounter (HOSPITAL_COMMUNITY): Payer: Self-pay | Admitting: Internal Medicine

## 2022-10-01 ENCOUNTER — Encounter (HOSPITAL_COMMUNITY): Payer: Self-pay | Admitting: Psychiatry

## 2022-10-01 ENCOUNTER — Telehealth (INDEPENDENT_AMBULATORY_CARE_PROVIDER_SITE_OTHER): Payer: Medicaid Other | Admitting: Psychiatry

## 2022-10-01 DIAGNOSIS — F331 Major depressive disorder, recurrent, moderate: Secondary | ICD-10-CM

## 2022-10-01 DIAGNOSIS — F411 Generalized anxiety disorder: Secondary | ICD-10-CM

## 2022-10-01 DIAGNOSIS — R110 Nausea: Secondary | ICD-10-CM

## 2022-10-01 DIAGNOSIS — K588 Other irritable bowel syndrome: Secondary | ICD-10-CM

## 2022-10-01 DIAGNOSIS — F4001 Agoraphobia with panic disorder: Secondary | ICD-10-CM | POA: Diagnosis not present

## 2022-10-01 DIAGNOSIS — F502 Bulimia nervosa: Secondary | ICD-10-CM | POA: Diagnosis not present

## 2022-10-01 DIAGNOSIS — F410 Panic disorder [episodic paroxysmal anxiety] without agoraphobia: Secondary | ICD-10-CM

## 2022-10-01 DIAGNOSIS — F4310 Post-traumatic stress disorder, unspecified: Secondary | ICD-10-CM | POA: Diagnosis not present

## 2022-10-01 MED ORDER — DULOXETINE HCL 20 MG PO CPEP: 20.0000 mg | ORAL_CAPSULE | Freq: Every day | ORAL | 2 refills | Status: DC
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
## 2022-10-01 NOTE — Patient Instructions (Signed)
We added Cymbalta 20 mg once daily to your regimen today.  This should help with some of the excess anxiety and depression.  If we find that we do need to change from the Remeron this will make it easier to do in the future.  I would still caution against weighing yourself and to reach out to your PCP to get a nutrition referral as well as those vitamin levels that we talked about previously.

## 2022-10-08 ENCOUNTER — Encounter: Payer: Self-pay | Admitting: Gastroenterology

## 2022-10-08 ENCOUNTER — Ambulatory Visit: Payer: Medicaid Other | Admitting: Gastroenterology

## 2022-10-17 ENCOUNTER — Encounter: Payer: Self-pay | Admitting: Internal Medicine

## 2022-10-17 ENCOUNTER — Ambulatory Visit (INDEPENDENT_AMBULATORY_CARE_PROVIDER_SITE_OTHER): Payer: Medicaid Other | Admitting: Internal Medicine

## 2022-10-17 VITALS — BP 123/77 | HR 95 | Ht 69.5 in | Wt 197.4 lb

## 2022-10-17 DIAGNOSIS — Z1329 Encounter for screening for other suspected endocrine disorder: Secondary | ICD-10-CM | POA: Diagnosis not present

## 2022-10-17 DIAGNOSIS — Z13220 Encounter for screening for lipoid disorders: Secondary | ICD-10-CM

## 2022-10-17 DIAGNOSIS — R109 Unspecified abdominal pain: Secondary | ICD-10-CM

## 2022-10-17 DIAGNOSIS — K219 Gastro-esophageal reflux disease without esophagitis: Secondary | ICD-10-CM

## 2022-10-17 DIAGNOSIS — F331 Major depressive disorder, recurrent, moderate: Secondary | ICD-10-CM | POA: Diagnosis not present

## 2022-10-17 DIAGNOSIS — Z1321 Encounter for screening for nutritional disorder: Secondary | ICD-10-CM | POA: Diagnosis not present

## 2022-10-17 DIAGNOSIS — E611 Iron deficiency: Secondary | ICD-10-CM

## 2022-10-17 DIAGNOSIS — Z23 Encounter for immunization: Secondary | ICD-10-CM | POA: Diagnosis not present

## 2022-10-17 DIAGNOSIS — G8929 Other chronic pain: Secondary | ICD-10-CM | POA: Diagnosis not present

## 2022-10-17 DIAGNOSIS — Z139 Encounter for screening, unspecified: Secondary | ICD-10-CM | POA: Diagnosis not present

## 2022-10-17 DIAGNOSIS — K588 Other irritable bowel syndrome: Secondary | ICD-10-CM | POA: Diagnosis not present

## 2022-10-17 DIAGNOSIS — Z131 Encounter for screening for diabetes mellitus: Secondary | ICD-10-CM

## 2022-10-17 NOTE — Patient Instructions (Signed)
It was a pleasure to see you today.  Thank you for giving [place] the opportunity to be involved in your care.  Below is a brief recap of your visit and next steps.  We will plan to see you again in 3 months.  Summary No medication changes Checking labs today You have been referred to nutrition Update tetanus vaccine today Follow up in 3 months

## 2022-10-17 NOTE — Assessment & Plan Note (Signed)
PHQ-9 score remains elevated today (18).  She is tearful today and expresses frustration with her current medication regimen as she believes it has worsened her anxiety and depression.  She is currently prescribed Remeron and Cymbalta.  She is also frustrated that bulimia has been suggested as a diagnosis. -I recommended she contact her psychiatrist (Dr. Adrian Blackwater) to discuss her concerns. -Repeat labs have been ordered today

## 2022-10-17 NOTE — Assessment & Plan Note (Signed)
Recently underwent EGD and HIDA scan that were unremarkable.  She states that her symptoms have improved with avoiding foods known to trigger her symptoms and attempting to manage her stress.  She is interested in establishing care with a nutritionist to discuss appropriate dieting habits. -Medical nutrition therapy referral placed today

## 2022-10-17 NOTE — Progress Notes (Signed)
Established Patient Office Visit  Subjective   Patient ID: Patty Gomez, female    DOB: 11-Nov-2000  Age: 22 y.o. MRN: [id number]  Chief Complaint  Patient presents with   Follow-up   Patty Gomez returns to care today for routine follow-up.  She was last evaluated by me on 2/2 at that time her acute concern for chronic abdominal pain and poorly controlled symptoms of depression.  In the interim she has establish care with gastroenterology in [place].  Underwent EGD and HIDA scan both of which were largely unremarkable.  She has also been seen by psychiatry.  Patty Gomez became tearful today when discussing the current state of her mental health.  She is frustrated with her current medications (Remeron and Cymbalta) as she feels as though they have worsened her depression.  She is also frustrated at the suggestion of a diagnosis of bulimia.  She otherwise has no acute concerns to discuss today.  Past Medical History:  Diagnosis Date   Acute bronchitis 01/20/2013   Anxiety state 08/22/2015   Depression    Encounter for IUD insertion 01/27/2018   Hair loss 03/11/2022   Iron deficiency 05/15/2022   Migraines    Pain and swelling of toe of left foot 02/11/2022   Preventative health care 02/11/2022   Screening examination for STD (sexually transmitted disease) 03/20/2021   Trichimoniasis    Vaginal burning 03/20/2021   Vaginal discharge 03/20/2021   Vaginal irritation 06/04/2021   Past Surgical History:  Procedure Laterality Date   BIOPSY  05/07/2019   Procedure: BIOPSY;  Surgeon: West Bali, MD;  Location: [facility];  Service: Endoscopy;;   BIOPSY  08/23/2022   Procedure: BIOPSY;  Surgeon: Lanelle Bal, DO;  Location: [facility];  Service: Endoscopy;;   COLONOSCOPY     ESOPHAGOGASTRODUODENOSCOPY N/A 05/07/2019   Procedure: ESOPHAGOGASTRODUODENOSCOPY (EGD);  Surgeon: West Bali, MD;  Location: [facility];  Service: Endoscopy;  Laterality: N/A;  3:00pm    ESOPHAGOGASTRODUODENOSCOPY (EGD) WITH PROPOFOL N/A 08/23/2022   Procedure: ESOPHAGOGASTRODUODENOSCOPY (EGD) WITH PROPOFOL;  Surgeon: Lanelle Bal, DO;  Location: [facility];  Service: Endoscopy;  Laterality: N/A;  1:15 pm ,asa 2   TONSILLECTOMY     Social History   Tobacco Use   Smoking status: Some Days    Types: E-cigarettes    Last attempt to quit: 05/2022    Years since quitting: 0.4   Smokeless tobacco: Never  Vaping Use   Vaping Use: Some days  Substance Use Topics   Alcohol use: Not Currently    Comment: 1 shot per week. Previously more frequent with 2-3 shots at a time.   Drug use: Not Currently    Types: Marijuana    Comment: tried marijuana a few times as a teenager   Family History  Problem Relation Age of Onset   Bipolar disorder Mother    Cancer Mother    Hypertension Paternal Grandmother    Diabetes Paternal Grandmother    Hyperlipidemia Paternal Grandmother    Heart disease Paternal Grandmother    Colon polyps Paternal Grandmother    Diabetes Other    Cancer Other    Colon cancer Neg Hx    No Known Allergies  Review of Systems  Psychiatric/Behavioral:  Positive for depression.   All other systems reviewed and are negative.     Objective:     BP 123/77   Pulse 95   Ht 5' 9.5" (1.765 m)   Wt 197  lb 6.4 oz (89.5 kg)   SpO2 98%   BMI 28.73 kg/m  BP Readings from Last 3 Encounters:  10/17/22 123/77  08/23/22 105/67  08/08/22 107/76   Physical Exam Vitals reviewed.  Constitutional:      General: She is not in acute distress.    Appearance: Normal appearance. She is not toxic-appearing.  HENT:     Head: Normocephalic and atraumatic.     Right Ear: External ear normal.     Left Ear: External ear normal.     Nose: Nose normal. No congestion or rhinorrhea.     Mouth/Throat:     Mouth: Mucous membranes are moist.     Pharynx: Oropharynx is clear. No oropharyngeal exudate or posterior oropharyngeal erythema.  Eyes:     General: No  scleral icterus.    Extraocular Movements: Extraocular movements intact.     Conjunctiva/sclera: Conjunctivae normal.     Pupils: Pupils are equal, round, and reactive to light.  Cardiovascular:     Rate and Rhythm: Normal rate and regular rhythm.     Pulses: Normal pulses.     Heart sounds: Normal heart sounds. No murmur heard.    No friction rub. No gallop.  Pulmonary:     Effort: Pulmonary effort is normal.     Breath sounds: Normal breath sounds. No wheezing, rhonchi or rales.  Abdominal:     General: Abdomen is flat. Bowel sounds are normal. There is no distension.     Palpations: Abdomen is soft.     Tenderness: There is no abdominal tenderness.  Musculoskeletal:        General: No swelling. Normal range of motion.     Cervical back: Normal range of motion.     Right lower leg: No edema.     Left lower leg: No edema.  Lymphadenopathy:     Cervical: No cervical adenopathy.  Skin:    General: Skin is warm and dry.     Capillary Refill: Capillary refill takes less than 2 seconds.     Coloration: Skin is not jaundiced.  Neurological:     General: No focal deficit present.     Mental Status: She is alert and oriented to person, place, and time.  Psychiatric:     Comments: Tearful during today's encounter   Last CBC Lab Results  Component Value Date   WBC 10.5 04/16/2022   HGB 14.2 04/16/2022   HCT 40.4 04/16/2022   MCV 86 04/16/2022   MCH 30.3 04/16/2022   RDW 12.1 04/16/2022   PLT 282 04/16/2022   Last metabolic panel Lab Results  Component Value Date   GLUCOSE 110 (H) 04/16/2022   NA 137 04/16/2022   K 3.5 04/16/2022   CL 94 (L) 04/16/2022   CO2 22 04/16/2022   BUN 11 04/16/2022   CREATININE 0.84 04/16/2022   EGFR 101 04/16/2022   CALCIUM 9.8 04/16/2022   PROT 7.5 04/16/2022   ALBUMIN 5.0 04/16/2022   LABGLOB 2.5 04/16/2022   AGRATIO 2.0 04/16/2022   BILITOT 1.0 04/16/2022   ALKPHOS 65 04/16/2022   AST 15 04/16/2022   ALT 8 04/16/2022   ANIONGAP 12  10/25/2019     Assessment & Plan:   Problem List Items Addressed This Visit       IBS (irritable bowel syndrome)    Recently underwent EGD and HIDA scan that were unremarkable.  She states that her symptoms have improved with avoiding foods known to trigger her symptoms and attempting to manage  her stress.  She is interested in establishing care with a nutritionist to discuss appropriate dieting habits. -Medical nutrition therapy referral placed today      GERD (gastroesophageal reflux disease)    Symptoms are currently well-controlled with Protonix twice daily.  No medication changes today.      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  She is tearful today and expresses frustration with her current medication regimen as she believes it has worsened her anxiety and depression.  She is currently prescribed Remeron and Cymbalta.  She is also frustrated that bulimia has been suggested as a diagnosis. -I recommended she contact her psychiatrist (Dr. Adrian Blackwater) to discuss her concerns. -Repeat labs have been ordered today      Need for Tdap vaccination    Tdap vaccine administered today      Return in about 3 months (around 01/17/2023).   Billie Lade, MD

## 2022-10-17 NOTE — Assessment & Plan Note (Signed)
-   Tdap vaccine administered today.

## 2022-10-17 NOTE — Assessment & Plan Note (Signed)
Symptoms are currently well-controlled with Protonix twice daily.  No medication changes today.

## 2022-10-18 ENCOUNTER — Other Ambulatory Visit: Payer: Self-pay | Admitting: Internal Medicine

## 2022-10-18 DIAGNOSIS — E559 Vitamin D deficiency, unspecified: Secondary | ICD-10-CM

## 2022-10-18 LAB — CBC WITH DIFFERENTIAL/PLATELET
Basophils Absolute: 0 10*3/uL (ref 0.0–0.2)
Basos: 0 %
EOS (ABSOLUTE): 0.1 10*3/uL (ref 0.0–0.4)
Eos: 1 %
Hematocrit: 42.1 % (ref 34.0–46.6)
Hemoglobin: 14 g/dL (ref 11.1–15.9)
Immature Grans (Abs): 0 10*3/uL (ref 0.0–0.1)
Immature Granulocytes: 0 %
Lymphocytes Absolute: 1.6 10*3/uL (ref 0.7–3.1)
Lymphs: 17 %
MCH: 30.8 pg (ref 26.6–33.0)
MCHC: 33.3 g/dL (ref 31.5–35.7)
MCV: 93 fL (ref 79–97)
Monocytes Absolute: 0.6 10*3/uL (ref 0.1–0.9)
Monocytes: 6 %
Neutrophils Absolute: 7 10*3/uL (ref 1.4–7.0)
Neutrophils: 76 %
Platelets: 262 10*3/uL (ref 150–450)
RBC: 4.54 x10E6/uL (ref 3.77–5.28)
RDW: 12.5 % (ref 11.7–15.4)
WBC: 9.2 10*3/uL (ref 3.4–10.8)

## 2022-10-18 LAB — CMP14+EGFR
ALT: 20 IU/L (ref 0–32)
AST: 22 IU/L (ref 0–40)
Albumin/Globulin Ratio: 2 (ref 1.2–2.2)
Albumin: 4.6 g/dL (ref 4.0–5.0)
Alkaline Phosphatase: 69 IU/L (ref 44–121)
BUN/Creatinine Ratio: 12 (ref 9–23)
BUN: 9 mg/dL (ref 6–20)
Bilirubin Total: 1.3 mg/dL — ABNORMAL HIGH (ref 0.0–1.2)
CO2: 23 mmol/L (ref 20–29)
Calcium: 9.6 mg/dL (ref 8.7–10.2)
Chloride: 100 mmol/L (ref 96–106)
Creatinine, Ser: 0.74 mg/dL (ref 0.57–1.00)
Globulin, Total: 2.3 g/dL (ref 1.5–4.5)
Glucose: 84 mg/dL (ref 70–99)
Potassium: 4.4 mmol/L (ref 3.5–5.2)
Sodium: 140 mmol/L (ref 134–144)
Total Protein: 6.9 g/dL (ref 6.0–8.5)
eGFR: 118 mL/min/{1.73_m2} (ref >59)

## 2022-10-18 LAB — LIPID PANEL
Chol/HDL Ratio: 1.8 ratio (ref 0.0–4.4)
Cholesterol, Total: 162 mg/dL (ref 100–199)
HDL: 91 mg/dL (ref >39)
LDL Chol Calc (NIH): 58 mg/dL (ref 0–99)
Triglycerides: 69 mg/dL (ref 0–149)
VLDL Cholesterol Cal: 13 mg/dL (ref 5–40)

## 2022-10-18 LAB — HEMOGLOBIN A1C
Est. average glucose Bld gHb Est-mCnc: 103 mg/dL
Hgb A1c MFr Bld: 5.2 % (ref 4.8–5.6)

## 2022-10-18 LAB — IRON,TIBC AND FERRITIN PANEL
Ferritin: 49 ng/mL (ref 15–150)
Iron Saturation: 32 % (ref 15–55)
Iron: 124 ug/dL (ref 27–159)
Total Iron Binding Capacity: 387 ug/dL (ref 250–450)
UIBC: 263 ug/dL (ref 131–425)

## 2022-10-18 LAB — TSH+FREE T4
Free T4: 1.26 ng/dL (ref 0.82–1.77)
TSH: 0.953 u[IU]/mL (ref 0.450–4.500)

## 2022-10-18 LAB — B12 AND FOLATE PANEL
Folate: 7.6 ng/mL (ref >3.0)
Vitamin B-12: 352 pg/mL (ref 232–1245)

## 2022-10-18 LAB — VITAMIN D 25 HYDROXY (VIT D DEFICIENCY, FRACTURES): Vit D, 25-Hydroxy: 18.7 ng/mL — ABNORMAL LOW (ref 30.0–100.0)

## 2022-10-18 MED ORDER — VITAMIN D (ERGOCALCIFEROL) 1.25 MG (50000 UNIT) PO CAPS: 50000.0000 [IU] | ORAL_CAPSULE | ORAL | 0 refills | Status: AC

## 2022-10-31 ENCOUNTER — Telehealth (HOSPITAL_COMMUNITY): Payer: Medicaid Other | Admitting: Psychiatry

## 2022-11-13 ENCOUNTER — Ambulatory Visit: Payer: Medicaid Other | Admitting: Internal Medicine

## 2022-11-26 ENCOUNTER — Encounter (HOSPITAL_COMMUNITY): Payer: Self-pay | Admitting: Psychiatry

## 2022-11-26 ENCOUNTER — Telehealth (INDEPENDENT_AMBULATORY_CARE_PROVIDER_SITE_OTHER): Payer: Medicaid Other | Admitting: Psychiatry

## 2022-11-26 DIAGNOSIS — F5081 Binge eating disorder: Secondary | ICD-10-CM | POA: Diagnosis not present

## 2022-11-26 DIAGNOSIS — F411 Generalized anxiety disorder: Secondary | ICD-10-CM

## 2022-11-26 DIAGNOSIS — F4001 Agoraphobia with panic disorder: Secondary | ICD-10-CM | POA: Diagnosis not present

## 2022-11-26 DIAGNOSIS — F331 Major depressive disorder, recurrent, moderate: Secondary | ICD-10-CM | POA: Diagnosis not present

## 2022-11-26 DIAGNOSIS — F902 Attention-deficit hyperactivity disorder, combined type: Secondary | ICD-10-CM | POA: Diagnosis not present

## 2022-11-26 DIAGNOSIS — K588 Other irritable bowel syndrome: Secondary | ICD-10-CM | POA: Diagnosis not present

## 2022-11-26 DIAGNOSIS — F4310 Post-traumatic stress disorder, unspecified: Secondary | ICD-10-CM

## 2022-11-26 DIAGNOSIS — F410 Panic disorder [episodic paroxysmal anxiety] without agoraphobia: Secondary | ICD-10-CM

## 2022-11-26 MED ORDER — LISDEXAMFETAMINE DIMESYLATE 20 MG PO CAPS: 20.0000 mg | ORAL_CAPSULE | Freq: Every day | ORAL | 0 refills | Status: DC
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
## 2022-11-26 NOTE — Patient Instructions (Addendum)
We restarted the Vyvanse to 20 mg once daily today.  Please continue to be on the lookout for any return of the nausea and vomiting (the Vyvanse should not be causing this) but it can cause appetite suppression.  We are primarily using it for the recent binge eating episodes but if you want to repeat the ADHD testing here the clinics that you can use: [facility] [phone]) or [facility] Asters at [facility]. [facility] [phone] has multiple providers. [facility] [phone] has several providers specializing in ADHD/Bipolar= Andrena Mews, PhD is expert at Adult ADHD, Verna Czech, PhD treats adults with both diagnoses.   When we meet next we will get a urine drug screen.

## 2022-12-03 ENCOUNTER — Ambulatory Visit (INDEPENDENT_AMBULATORY_CARE_PROVIDER_SITE_OTHER): Payer: Medicaid Other | Admitting: Clinical

## 2022-12-03 DIAGNOSIS — F410 Panic disorder [episodic paroxysmal anxiety] without agoraphobia: Secondary | ICD-10-CM | POA: Diagnosis not present

## 2022-12-03 DIAGNOSIS — F331 Major depressive disorder, recurrent, moderate: Secondary | ICD-10-CM

## 2022-12-03 DIAGNOSIS — F902 Attention-deficit hyperactivity disorder, combined type: Secondary | ICD-10-CM | POA: Diagnosis not present

## 2022-12-03 DIAGNOSIS — F4310 Post-traumatic stress disorder, unspecified: Secondary | ICD-10-CM | POA: Diagnosis not present

## 2022-12-03 DIAGNOSIS — F411 Generalized anxiety disorder: Secondary | ICD-10-CM

## 2022-12-03 NOTE — Progress Notes (Signed)
Virtual Visit via Video Note   I connected with Patty Gomez on 12/03/22 at  1:00 PM EST by a video enabled telemedicine application and verified that I am speaking with the correct person using two identifiers.   Location: Patient: Home Provider: Office   I discussed the limitations of evaluation and management by telemedicine and the availability of in person appointments. The patient expressed understanding and agreed to proceed.   THERAPY PROGRESS NOTE   Session Time: 1:00 PM- 1:20 PM   Participation Level: Active   Behavioral Response: CasualAlert/Hyperverbal   Type of Therapy: Individual Therapy   Treatment Goals addressed: Coping   Interventions: CBT, DBT, Solution Focused, Strength-based and Supportive   Summary: Patty Gomez is a 22y.o. female who presents with Depression, Anxiety, PTSD, ADHD . The OPT therapist worked with the patient for her OPT treatment session. The OPT therapist utilized Motivational Interviewing to assist in creating therapeutic repore. The patient in the session was engaged and work in collaboration giving feedback about her triggers and symptoms over the past few weeks. The patient spoke about her work to better control her MH symptoms and is back on her ADHD medication. The patient spoke about moving, leaving her toxic relationship, and getting into legal trouble getting a DUI. The patient has a upcoming court date July 17th (DUI) and August 8th (revoked license). The patient is currently living back with her grandparents. The patient is recommitted with her faith in god and working to stay positive. The OPT therapist worked with the patient in a exercise in session to identify and challenge (ANTS) and implement positive self mantra. . The patient noted she is current working/employed and focused on trying to stay out of/ not get into more legal trouble and noted she has stopped drinking.   Suicidal/Homicidal: Nowithout intent/plan   Therapist  Response:The OPT therapist worked with the patient for the patients scheduled session. The patient was engaged in her session and gave feedback in relation to triggers, symptoms, and behavior responses over the past few weeks. The patient identified  working to not allow automatic negative thoughts impact her mood and self esteem. The patient over-viewed a variety of changes since her last session including getting employed, getting back on medication, leaving toxic relationship, adjusting to living back with grandparents, getting a DUI, recommitting to her faith. The OPT therapist worked with the patient utilizing an in session Cognitive Behavioral Therapy exercise. The patient was responsive in the session and verbalized, " I am just giving it to god and I have got back into my church and now I am around people and not just in my house with the dog ". The OPT therapist worked with the patient overviewing basic health areas including sleep cycle, eating habits, hygiene, and physical exercise. The patient in this session verbalized no current S/I or H/I.  The patient has been staying some with  her grandparents. The patient spoke about taking an approach of being more adult-like and proactive with things that need to be taken care of. The patient has continued to care for her pet, read her bible, working on art, and is working to learn a new language. The patient spoke about her goal of going back to school. The patient has upcoming appointments for her healthcare with Gastrology, Gynecology, and Psychiatry. The patient spoke about her involvement with a ex-boyfriend who she has been interacting with which has been a external stressor.The OPT therapist will continue treatment work with the patient in her  next scheduled session.   Plan: Return again in 2/3 weeks.   Diagnosis:      Axis I:Depression GAD PTSD ADHD                           Axis II: No diagnosis       Collaboration of Care: Overview of  medication management program involvement with psychiatrist Dr. Tenny Craw.   Patient/Guardian was advised Release of Information must be obtained prior to any record release in order to collaborate their care with an outside provider. Patient/Guardian was advised if they have not already done so to contact the registration department to sign all necessary forms in order for [place] to release information regarding their care.    Consent: Patient/Guardian gives verbal consent for treatment and assignment of benefits for services provided during this visit. Patient/Guardian expressed understanding and agreed to proceed.    I discussed the assessment and treatment plan with the patient. The patient was provided an opportunity to ask questions and all were answered. The patient agreed with the plan and demonstrated an understanding of the instructions.   The patient was advised to call back or seek an in-person evaluation if the symptoms worsen or if the condition fails to improve as anticipated.   I provided 20 minutes of non-face-to-face time during this encounter.   Suzan Garibaldi, LCSW   12/03/2022

## 2022-12-10 ENCOUNTER — Encounter (HOSPITAL_COMMUNITY): Payer: Self-pay

## 2022-12-13 ENCOUNTER — Encounter: Payer: Self-pay | Admitting: Adult Health

## 2022-12-13 ENCOUNTER — Ambulatory Visit (INDEPENDENT_AMBULATORY_CARE_PROVIDER_SITE_OTHER): Payer: Medicaid Other | Admitting: Adult Health

## 2022-12-13 VITALS — BP 113/72 | HR 84 | Ht 69.5 in | Wt 196.0 lb

## 2022-12-13 DIAGNOSIS — N920 Excessive and frequent menstruation with regular cycle: Secondary | ICD-10-CM | POA: Diagnosis not present

## 2022-12-13 DIAGNOSIS — R102 Pelvic and perineal pain: Secondary | ICD-10-CM | POA: Insufficient documentation

## 2022-12-13 DIAGNOSIS — B9689 Other specified bacterial agents as the cause of diseases classified elsewhere: Secondary | ICD-10-CM | POA: Diagnosis not present

## 2022-12-13 DIAGNOSIS — Z975 Presence of (intrauterine) contraceptive device: Secondary | ICD-10-CM

## 2022-12-13 DIAGNOSIS — N760 Acute vaginitis: Secondary | ICD-10-CM | POA: Diagnosis not present

## 2022-12-13 LAB — POCT WET PREP (WET MOUNT)
Clue Cells Wet Prep Whiff POC: NEGATIVE
WBC, Wet Prep HPF POC: POSITIVE

## 2022-12-13 MED ORDER — DICYCLOMINE HCL 10 MG PO CAPS: 10.0000 mg | ORAL_CAPSULE | Freq: Three times a day (TID) | ORAL | 6 refills | Status: DC

## 2022-12-13 MED ORDER — METRONIDAZOLE 500 MG PO TABS: 500.0000 mg | ORAL_TABLET | Freq: Two times a day (BID) | ORAL | 0 refills | Status: DC

## 2022-12-13 NOTE — Progress Notes (Signed)
  Subjective:     Patient ID: Patty Gomez, female   DOB: November 20, 2000, 22 y.o.   MRN: [id number]  HPI Patty Gomez is a 22 year old white female,single, G0P0, in complaining of spotting with IUD, itching and stomach cramps, into back and has had loose stool for 2 weeks, better today. On vyvanse now.     Component Value Date/Time   DIAGPAP  01/09/2022 1618    - Negative for intraepithelial lesion or malignancy (NILM)   ADEQPAP  01/09/2022 1618    Satisfactory for evaluation; transformation zone component ABSENT.   PCP is Dr Durwin Nora  Review of Systems Spotting with IUD +itching +stomach cramps, into back Loose stools for 2 weeks, better today No sex in 6-7 months  Reviewed past medical,surgical, social and family history. Reviewed medications and allergies.     Objective:   Physical Exam BP 113/72 (BP Location: Left Arm, Patient Position: Sitting, Cuff Size: Normal)   Pulse 84   Ht 5' 9.5" (1.765 m)   Wt 196 lb (88.9 kg)   BMI 28.53 kg/m   Skin warm and dry.Pelvic: external genitalia is normal in appearance no lesions, vagina: tan discharge without odor,urethra has no lesions or masses noted, cervix:smooth, +IUD strings at os, uterus: normal size, shape and contour, non tender, no masses felt, adnexa: no masses, LLQ tenderness noted. Bladder is non tender and no masses felt. Wet prep: + for clue cells and +WBCs.  Fall risk is low  Upstream - 12/13/22 1049       Pregnancy Intention Screening   Does the patient want to become pregnant in the next year? No    Does the patient's partner want to become pregnant in the next year? No    Would the patient like to discuss contraceptive options today? No      Contraception Wrap Up   Current Method IUD or IUS    End Method IUD or IUS    Contraception Counseling Provided No            Examination chaperoned by Faith Rogue LPN    Assessment:     1. Spotting +Tan discharge - POCT Wet Prep [facility])  2. Pelvic cramping Will  refill bentyl Meds ordered this encounter  Medications   metroNIDAZOLE (FLAGYL) 500 MG tablet    Sig: Take 1 tablet (500 mg total) by mouth 2 (two) times daily.    Dispense:  14 tablet    Refill:  0    Order Specific Question:   Supervising Provider    Answer:   Duane Lope H [[id number]]   dicyclomine (BENTYL) 10 MG capsule    Sig: Take 1 capsule (10 mg total) by mouth 3 (three) times daily before meals.    Dispense:  90 capsule    Refill:  6    Order Specific Question:   Supervising Provider    Answer:   Duane Lope H [[id number]]    See GI if persists 3. IUD (intrauterine device) in place +strings at os  Placed 01/27/18  4. BV (bacterial vaginosis) +clues cells  Will rx flagyl   - POCT Wet Prep [facility])     Plan:     Follow up prn

## 2022-12-26 ENCOUNTER — Telehealth (HOSPITAL_COMMUNITY): Payer: Medicaid Other | Admitting: Psychiatry

## 2022-12-30 ENCOUNTER — Telehealth (INDEPENDENT_AMBULATORY_CARE_PROVIDER_SITE_OTHER): Payer: Medicaid Other | Admitting: Psychiatry

## 2022-12-30 ENCOUNTER — Encounter (HOSPITAL_COMMUNITY): Payer: Self-pay | Admitting: Psychiatry

## 2022-12-30 DIAGNOSIS — F4310 Post-traumatic stress disorder, unspecified: Secondary | ICD-10-CM | POA: Diagnosis not present

## 2022-12-30 DIAGNOSIS — F410 Panic disorder [episodic paroxysmal anxiety] without agoraphobia: Secondary | ICD-10-CM

## 2022-12-30 DIAGNOSIS — F331 Major depressive disorder, recurrent, moderate: Secondary | ICD-10-CM

## 2022-12-30 DIAGNOSIS — F5081 Binge eating disorder: Secondary | ICD-10-CM | POA: Diagnosis not present

## 2022-12-30 DIAGNOSIS — Z79899 Other long term (current) drug therapy: Secondary | ICD-10-CM

## 2022-12-30 DIAGNOSIS — F411 Generalized anxiety disorder: Secondary | ICD-10-CM | POA: Diagnosis not present

## 2022-12-30 DIAGNOSIS — F902 Attention-deficit hyperactivity disorder, combined type: Secondary | ICD-10-CM

## 2022-12-30 MED ORDER — LISDEXAMFETAMINE DIMESYLATE 30 MG PO CAPS: 30.0000 mg | ORAL_CAPSULE | Freq: Every day | ORAL | 0 refills | Status: DC
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
## 2022-12-30 NOTE — Patient Instructions (Signed)
We increased the vyvanse to 30mg  once daily today. When you are able, please come by the clinic for urine drug screen. If it would be helpful, contact your lawyer and let us know if any notes or communication would be helpful for your case.

## 2022-12-31 ENCOUNTER — Ambulatory Visit (INDEPENDENT_AMBULATORY_CARE_PROVIDER_SITE_OTHER): Payer: Medicaid Other | Admitting: Clinical

## 2022-12-31 DIAGNOSIS — F411 Generalized anxiety disorder: Secondary | ICD-10-CM

## 2022-12-31 DIAGNOSIS — F410 Panic disorder [episodic paroxysmal anxiety] without agoraphobia: Secondary | ICD-10-CM | POA: Diagnosis not present

## 2022-12-31 DIAGNOSIS — F4310 Post-traumatic stress disorder, unspecified: Secondary | ICD-10-CM

## 2022-12-31 DIAGNOSIS — F902 Attention-deficit hyperactivity disorder, combined type: Secondary | ICD-10-CM

## 2022-12-31 DIAGNOSIS — F332 Major depressive disorder, recurrent severe without psychotic features: Secondary | ICD-10-CM | POA: Diagnosis not present

## 2022-12-31 NOTE — Progress Notes (Signed)
Virtual Visit via Video Note   I connected with Patty Gomez on 12/31/22 at  10:00 AM EST by a video enabled telemedicine application and verified that I am speaking with the correct person using two identifiers.   Location: Patient: Home Provider: Office   I discussed the limitations of evaluation and management by telemedicine and the availability of in person appointments. The patient expressed understanding and agreed to proceed.   THERAPY PROGRESS NOTE   Session Time: 10:00 AM- 10:30 AM   Participation Level: Active   Behavioral Response: CasualAlert/Hyperverbal   Type of Therapy: Individual Therapy   Treatment Goals addressed: Coping   Interventions: CBT, DBT, Solution Focused, Strength-based and Supportive   Summary: Patty Gomez is a 22y.o. female who presents with Depression, Anxiety, PTSD, ADHD . The OPT therapist worked with the patient for her OPT treatment session. The OPT therapist utilized Motivational Interviewing to assist in creating therapeutic repore. The patient in the session was engaged and work in collaboration giving feedback about her triggers and symptoms over the past few weeks. The patient spoke about change to her  ADHD medication with increase in dosage from her psychiatrist visit yesterday. The patient spoke about the aftermath of getting a DUI (this is not the patients first DUI) with a court hearing tomorrow morning and she is currently working to pay her lawyer and anticipates her attorney getting a continuance to allow her to meet her recommendations from the court. The patient is currently living back with her grandparents, but having difficulty adjusting to living with family and not being in her own home as she was use to prior to moving back in with family. The patient is recommitted with her faith in god and working to stay positive and spoke in this session about trying not to get emotional and trusting in her faith that she will not get sentenced  to jail for her current legal trouble. The OPT therapist worked with the patient in a exercise in session to identify and challenge (ANTS) and implement positive self mantra. 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   Suicidal/Homicidal: Nowithout intent/plan   Therapist Response:The OPT therapist worked with the patient for the patients scheduled session. The patient was engaged in her session and gave feedback in relation to triggers, symptoms, and behavior responses over the past few weeks. The patient identified  working to not allow automatic negative thoughts impact her mood and self esteem. The patient over-viewed a variety of changes since her last session including losing her job, change with her medication with increase in her dosage from psychiatrist, ongoing difficulty with  adjusting to living back with grandparents. The patient has a court hearing tomorrow for her recent DUI charge. The OPT therapist worked with the patient utilizing an in session Cognitive Behavioral Therapy exercise. The patient was responsive in the session and verbalized, " I would like to get back on my feet and get back to having my own place asap ". The OPT therapist worked with the patient overviewing basic health areas including sleep cycle, eating habits, hygiene, and physical exercise. The patient in this session verbalized no current S/I or H/I. The patient still was very hyperverbal throughout her session and is just starting the higher dose of her ADHD medication today.The OPT therapist will continue treatment work with  the patient in her next scheduled session.   Plan: Return again in 2/3 weeks.   Diagnosis:      Axis I:Depression GAD PTSD ADHD                           Axis II: No diagnosis       Collaboration of Care: Overview of  medication management program involvement with psychiatrist Dr. Tenny Craw.   Patient/Guardian was advised Release of Information must be obtained prior to any record release in order to collaborate their care with an outside provider. Patient/Guardian was advised if they have not already done so to contact the registration department to sign all necessary forms in order for [place] to release information regarding their care.    Consent: Patient/Guardian gives verbal consent for treatment and assignment of benefits for services provided during this visit. Patient/Guardian expressed understanding and agreed to proceed.    I discussed the assessment and treatment plan with the patient. The patient was provided an opportunity to ask questions and all were answered. The patient agreed with the plan and demonstrated an understanding of the instructions.   The patient was advised to call back or seek an in-person evaluation if the symptoms worsen or if the condition fails to improve as anticipated.   I provided 30 minutes of non-face-to-face time during this encounter.   Suzan Garibaldi, LCSW   12/31/2022

## 2023-01-17 ENCOUNTER — Encounter: Payer: Self-pay | Admitting: Internal Medicine

## 2023-01-17 ENCOUNTER — Ambulatory Visit (INDEPENDENT_AMBULATORY_CARE_PROVIDER_SITE_OTHER): Payer: Medicaid Other | Admitting: Internal Medicine

## 2023-01-17 VITALS — BP 105/72 | HR 84 | Ht 69.5 in | Wt 189.4 lb

## 2023-01-17 DIAGNOSIS — K588 Other irritable bowel syndrome: Secondary | ICD-10-CM | POA: Diagnosis not present

## 2023-01-17 DIAGNOSIS — F5081 Binge eating disorder: Secondary | ICD-10-CM

## 2023-01-17 DIAGNOSIS — K219 Gastro-esophageal reflux disease without esophagitis: Secondary | ICD-10-CM

## 2023-01-17 DIAGNOSIS — K625 Hemorrhage of anus and rectum: Secondary | ICD-10-CM

## 2023-01-17 DIAGNOSIS — F331 Major depressive disorder, recurrent, moderate: Secondary | ICD-10-CM | POA: Diagnosis not present

## 2023-01-17 DIAGNOSIS — Z139 Encounter for screening, unspecified: Secondary | ICD-10-CM

## 2023-01-17 NOTE — Progress Notes (Signed)
Established Patient Office Visit  Subjective   Patient ID: Patty Gomez, female    DOB: May 19, 2001  Age: 22 y.o. MRN: [id number]  Chief Complaint  Patient presents with   Depression    Follow up   Ms. Vassar returns to care today for follow-up.  She was last evaluated by me on 5/2 at that time she was referred to medical nutrition therapy in the setting of IBS and she expressed an interest in discussing appropriate dietary habits.  No additional medication changes were made and 23-month follow-up was arranged.  In the interim she has been seen by psychiatry, counseling, and gynecology for follow-up.  There have otherwise been no acute interval events.  Ms. Ballantine reports feeling fairly well today.  She reports that she quit her job yesterday due to a lack of support and endorses intermittent rectal bleeding.  She notices bright red blood in the toilet after a bowel movement but states that it is not mixed with her stool.  This is not a new issue.  She endorses a history of hemorrhoids and has undergone banding previously.  Past Medical History:  Diagnosis Date   Acute bronchitis 01/20/2013   Anxiety state 08/22/2015   Depression    Encounter for IUD insertion 01/27/2018   Hair loss 03/11/2022   Iron deficiency 05/15/2022   Migraines    Pain and swelling of toe of left foot 02/11/2022   Preventative health care 02/11/2022   Screening examination for STD (sexually transmitted disease) 03/20/2021   Trichimoniasis    Vaginal burning 03/20/2021   Vaginal discharge 03/20/2021   Vaginal irritation 06/04/2021   Past Surgical History:  Procedure Laterality Date   BIOPSY  05/07/2019   Procedure: BIOPSY;  Surgeon: West Bali, MD;  Location: [facility];  Service: Endoscopy;;   BIOPSY  08/23/2022   Procedure: BIOPSY;  Surgeon: Lanelle Bal, DO;  Location: [facility];  Service: Endoscopy;;   COLONOSCOPY     ESOPHAGOGASTRODUODENOSCOPY N/A 05/07/2019   Procedure:  ESOPHAGOGASTRODUODENOSCOPY (EGD);  Surgeon: West Bali, MD;  Location: [facility];  Service: Endoscopy;  Laterality: N/A;  3:00pm   ESOPHAGOGASTRODUODENOSCOPY (EGD) WITH PROPOFOL N/A 08/23/2022   Procedure: ESOPHAGOGASTRODUODENOSCOPY (EGD) WITH PROPOFOL;  Surgeon: Lanelle Bal, DO;  Location: [facility];  Service: Endoscopy;  Laterality: N/A;  1:15 pm ,asa 2   TONSILLECTOMY     Social History   Tobacco Use   Smoking status: Some Days    Types: E-cigarettes    Last attempt to quit: 05/2022    Years since quitting: 0.6   Smokeless tobacco: Never  Vaping Use   Vaping status: Some Days  Substance Use Topics   Alcohol use: Not Currently    Comment: 1 shot per week; got DUI in 2024. Previously more frequent with 2-3 shots at a time.   Drug use: Not Currently    Types: Marijuana    Comment: tried marijuana a few times as a teenager; hit vape pen with THC in 2024 by accident   Family History  Problem Relation Age of Onset   Bipolar disorder Mother    Cancer Mother    Hypertension Paternal Grandmother    Diabetes Paternal Grandmother    Hyperlipidemia Paternal Grandmother    Heart disease Paternal Grandmother    Colon polyps Paternal Grandmother    Diabetes Other    Cancer Other    Colon cancer Neg Hx    Allergies  Allergen Reactions   Dust  Mite Extract    Review of Systems  Gastrointestinal:  Positive for blood in stool.  All other systems reviewed and are negative.    Objective:     BP 105/72   Pulse 84   Ht 5' 9.5" (1.765 m)   Wt 189 lb 6.4 oz (85.9 kg)   SpO2 97%   BMI 27.57 kg/m  BP Readings from Last 3 Encounters:  01/17/23 105/72  12/13/22 113/72  10/17/22 123/77   Physical Exam Vitals reviewed.  Constitutional:      General: She is not in acute distress.    Appearance: Normal appearance. She is not toxic-appearing.  HENT:     Head: Normocephalic and atraumatic.     Right Ear: External ear normal.     Left Ear: External ear normal.      Nose: Nose normal. No congestion or rhinorrhea.     Mouth/Throat:     Mouth: Mucous membranes are moist.     Pharynx: Oropharynx is clear. No oropharyngeal exudate or posterior oropharyngeal erythema.  Eyes:     General: No scleral icterus.    Extraocular Movements: Extraocular movements intact.     Conjunctiva/sclera: Conjunctivae normal.     Pupils: Pupils are equal, round, and reactive to light.  Cardiovascular:     Rate and Rhythm: Normal rate and regular rhythm.     Pulses: Normal pulses.     Heart sounds: Normal heart sounds. No murmur heard.    No friction rub. No gallop.  Pulmonary:     Effort: Pulmonary effort is normal.     Breath sounds: Normal breath sounds. No wheezing, rhonchi or rales.  Abdominal:     General: Abdomen is flat. Bowel sounds are normal. There is no distension.     Palpations: Abdomen is soft.     Tenderness: There is no abdominal tenderness.  Musculoskeletal:        General: No swelling. Normal range of motion.     Cervical back: Normal range of motion.     Right lower leg: No edema.     Left lower leg: No edema.  Lymphadenopathy:     Cervical: No cervical adenopathy.  Skin:    General: Skin is warm and dry.     Capillary Refill: Capillary refill takes less than 2 seconds.     Coloration: Skin is not jaundiced.  Neurological:     General: No focal deficit present.     Mental Status: She is alert and oriented to person, place, and time.  Psychiatric:        Mood and Affect: Mood normal.        Behavior: Behavior normal.   Last CBC Lab Results  Component Value Date   WBC 9.2 10/17/2022   HGB 14.0 10/17/2022   HCT 42.1 10/17/2022   MCV 93 10/17/2022   MCH 30.8 10/17/2022   RDW 12.5 10/17/2022   PLT 262 10/17/2022   Last metabolic panel Lab Results  Component Value Date   GLUCOSE 84 10/17/2022   NA 140 10/17/2022   K 4.4 10/17/2022   CL 100 10/17/2022   CO2 23 10/17/2022   BUN 9 10/17/2022   CREATININE 0.74 10/17/2022   EGFR 118  10/17/2022   CALCIUM 9.6 10/17/2022   PROT 6.9 10/17/2022   ALBUMIN 4.6 10/17/2022   LABGLOB 2.3 10/17/2022   AGRATIO 2.0 10/17/2022   BILITOT 1.3 (H) 10/17/2022   ALKPHOS 69 10/17/2022   AST 22 10/17/2022   ALT 20 10/17/2022  ANIONGAP 12 10/25/2019   Last lipids Lab Results  Component Value Date   CHOL 162 10/17/2022   HDL 91 10/17/2022   LDLCALC 58 10/17/2022   TRIG 69 10/17/2022   CHOLHDL 1.8 10/17/2022   Last hemoglobin A1c Lab Results  Component Value Date   HGBA1C 5.2 10/17/2022   Last thyroid functions Lab Results  Component Value Date   TSH 0.953 10/17/2022   Last vitamin D Lab Results  Component Value Date   VD25OH 18.7 (L) 10/17/2022   Last vitamin B12 and Folate Lab Results  Component Value Date   VITAMINB12 352 10/17/2022   FOLATE 7.6 10/17/2022     Assessment & Plan:   Problem List Items Addressed This Visit       IBS (irritable bowel syndrome)    Followed by GI.  Recent EGD and HIDA scan were unremarkable.  Previously referred to medical nutrition therapy, but she decided not to establish care.  She continues as needed use of Bentyl.      GERD (gastroesophageal reflux disease) - Primary    Symptoms remain adequately controlled with Protonix.  No medication changes are indicated today.      Bright red blood per rectum    She has intermittently noticed bright red blood in the toilet after having a bowel movement but states that it is not mixed with her stool.  She endorses a history of hemorrhoids as well.  I have recommended that she follow-up with her gastroenterologist to discuss further treatment.      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  She feels that psychotherapy has been beneficial.      Binge eating disorder (Chronic)    Followed by psychiatry (Dr. Adrian Blackwater).  Remeron has been discontinued in favor of Vyvanse.  She is scheduled for psychiatry follow-up later this  month.  She will complete previously ordered UDS today.       Return in about 6 months (around 07/20/2023).    Billie Lade, MD

## 2023-01-17 NOTE — Assessment & Plan Note (Signed)
She has intermittently noticed bright red blood in the toilet after having a bowel movement but states that it is not mixed with her stool.  She endorses a history of hemorrhoids as well.  I have recommended that she follow-up with her gastroenterologist to discuss further treatment.

## 2023-01-17 NOTE — Assessment & Plan Note (Signed)
Symptoms remain adequately controlled with Protonix.  No medication changes are indicated today.

## 2023-01-17 NOTE — Patient Instructions (Signed)
It was a pleasure to see you today.  Thank you for giving [place] the opportunity to be involved in your care.  Below is a brief recap of your visit and next steps.  We will plan to see you again in 6 months.  Summary No medication changes today We will follow up in 6 months

## 2023-01-17 NOTE — Assessment & Plan Note (Signed)
Mood is currently stable.  She feels that psychotherapy has been beneficial.

## 2023-01-17 NOTE — Assessment & Plan Note (Signed)
Followed by psychiatry (Dr. Adrian Blackwater).  Remeron has been discontinued in favor of Vyvanse.  She is scheduled for psychiatry follow-up later this month.  She will complete previously ordered UDS today.

## 2023-01-17 NOTE — Addendum Note (Signed)
Addended by: Telford Nab on: 01/17/2023 02:17 PM   Modules accepted: Orders

## 2023-01-17 NOTE — Assessment & Plan Note (Signed)
Followed by GI.  Recent EGD and HIDA scan were unremarkable.  Previously referred to medical nutrition therapy, but she decided not to establish care.  She continues as needed use of Bentyl.

## 2023-01-21 NOTE — Telephone Encounter (Signed)
Spoke with pt advised lab she provided was for Dr Durwin Nora. Advised pt that order that Dr Adrian Blackwater needs is still active and she can come pick up order and go to [facility] to have [facility]. Pt states that she will come when she is able to. Pt aware Dr Adrian Blackwater is on vacation this week.

## 2023-01-28 ENCOUNTER — Ambulatory Visit (INDEPENDENT_AMBULATORY_CARE_PROVIDER_SITE_OTHER): Payer: Medicaid Other | Admitting: Clinical

## 2023-01-28 DIAGNOSIS — F411 Generalized anxiety disorder: Secondary | ICD-10-CM | POA: Diagnosis not present

## 2023-01-28 DIAGNOSIS — F32A Depression, unspecified: Secondary | ICD-10-CM

## 2023-01-28 DIAGNOSIS — F332 Major depressive disorder, recurrent severe without psychotic features: Secondary | ICD-10-CM

## 2023-01-28 DIAGNOSIS — F902 Attention-deficit hyperactivity disorder, combined type: Secondary | ICD-10-CM

## 2023-01-28 DIAGNOSIS — F410 Panic disorder [episodic paroxysmal anxiety] without agoraphobia: Secondary | ICD-10-CM

## 2023-01-28 DIAGNOSIS — F4310 Post-traumatic stress disorder, unspecified: Secondary | ICD-10-CM

## 2023-01-28 DIAGNOSIS — F909 Attention-deficit hyperactivity disorder, unspecified type: Secondary | ICD-10-CM | POA: Diagnosis not present

## 2023-01-28 NOTE — Progress Notes (Signed)
Virtual Visit via Video Note   I connected with Patty Gomez on 01/28/23 at  10:00 AM EST by a video enabled telemedicine application and verified that I am speaking with the correct person using two identifiers.   Location: Patient: Home Provider: Office   I discussed the limitations of evaluation and management by telemedicine and the availability of in person appointments. The patient expressed understanding and agreed to proceed.   THERAPY PROGRESS NOTE   Session Time: 10:00 AM- 10:45 AM   Participation Level: Active   Behavioral Response: CasualAlert/Hyperverbal   Type of Therapy: Individual Therapy   Treatment Goals addressed: Coping   Interventions: CBT, DBT, Solution Focused, Strength-based and Supportive   Summary: Patty Gomez is a 22y.o. female who presents with Depression, Anxiety, PTSD, ADHD . The OPT therapist worked with the patient for her OPT treatment session. The OPT therapist utilized Motivational Interviewing to assist in creating therapeutic repore. The patient in the session was engaged and work in collaboration giving feedback about her triggers and symptoms over the past few weeks. The patient spoke about the aftermath of getting a DUI with a court hearing continuance until Sept 17th which she expects at that time her case will again be continued to allow her more time to complete court recommendation for the patient to attend a rehab facility. The patient spoke about finding a place she is considering for her rehab. The patient is living with her grandparents, but  still having difficulty adjusting to living with family and not being in her own home. The patient is recommitted with her faith in god and working to stay positive and spoke in this session about trying not to get emotional and trusting in her faith that she will get back to herself and life (out of legal trouble and back to living independently). The OPT therapist worked with the patient in a exercise  in session to identify and challenge (ANTS) and implement positive self mantra.The OPT therapist worked with the patient on her impulsivity and decision making during the session.   Suicidal/Homicidal: Nowithout intent/plan   Therapist Response:The OPT therapist worked with the patient for the patients scheduled session. The patient was engaged in her session and gave feedback in relation to triggers, symptoms, and behavior responses over the past few weeks. The patient identified  working to not allow automatic negative thoughts impact her mood and self esteem. The patient over-viewed a variety of changes since her last session including gaining and again losing a job. The patient continues to work in adjusting to living back with grandparents. The patient has a court hearing mid September  for her recent DUI charge and under court recommendation to attend rehab. The OPT therapist worked with the patient utilizing an in session Cognitive Behavioral Therapy exercise. The patient was responsive in the session and verbalized, " I would like to get back to feeling like myself and get back to having my own place ". The OPT therapist worked with the patient overviewing basic health areas including sleep cycle, eating habits, hygiene, and physical exercise. The patient in this session verbalized no current S/I or H/I. The patient still was very hyperverbal throughout her session. The OPT therapist worked with the patient on coping and time management including utilizing a to do list and journaling.The OPT therapist will continue treatment work with the patient in her next scheduled session.   Plan: Return again in 2/3 weeks.   Diagnosis:      Axis I:Depression GAD  PTSD ADHD                           Axis II: No diagnosis       Collaboration of Care: Overview of medication management program involvement with psychiatrist Dr. Tenny Craw.   Patient/Guardian was advised Release of Information must be obtained prior  to any record release in order to collaborate their care with an outside provider. Patient/Guardian was advised if they have not already done so to contact the registration department to sign all necessary forms in order for [place] to release information regarding their care.    Consent: Patient/Guardian gives verbal consent for treatment and assignment of benefits for services provided during this visit. Patient/Guardian expressed understanding and agreed to proceed.    I discussed the assessment and treatment plan with the patient. The patient was provided an opportunity to ask questions and all were answered. The patient agreed with the plan and demonstrated an understanding of the instructions.   The patient was advised to call back or seek an in-person evaluation if the symptoms worsen or if the condition fails to improve as anticipated.   I provided 45 minutes of non-face-to-face time during this encounter.   Suzan Garibaldi, LCSW   01/28/2023

## 2023-02-03 ENCOUNTER — Telehealth (INDEPENDENT_AMBULATORY_CARE_PROVIDER_SITE_OTHER): Payer: Medicaid Other | Admitting: Psychiatry

## 2023-02-03 ENCOUNTER — Encounter (HOSPITAL_COMMUNITY): Payer: Self-pay | Admitting: Psychiatry

## 2023-02-03 DIAGNOSIS — M549 Dorsalgia, unspecified: Secondary | ICD-10-CM

## 2023-02-03 DIAGNOSIS — Z9151 Personal history of suicidal behavior: Secondary | ICD-10-CM

## 2023-02-03 DIAGNOSIS — E559 Vitamin D deficiency, unspecified: Secondary | ICD-10-CM

## 2023-02-03 DIAGNOSIS — F4310 Post-traumatic stress disorder, unspecified: Secondary | ICD-10-CM

## 2023-02-03 DIAGNOSIS — F17290 Nicotine dependence, other tobacco product, uncomplicated: Secondary | ICD-10-CM

## 2023-02-03 DIAGNOSIS — F4001 Agoraphobia with panic disorder: Secondary | ICD-10-CM

## 2023-02-03 DIAGNOSIS — G43909 Migraine, unspecified, not intractable, without status migrainosus: Secondary | ICD-10-CM

## 2023-02-03 DIAGNOSIS — F4000 Agoraphobia, unspecified: Secondary | ICD-10-CM

## 2023-02-03 DIAGNOSIS — F411 Generalized anxiety disorder: Secondary | ICD-10-CM | POA: Diagnosis not present

## 2023-02-03 DIAGNOSIS — F410 Panic disorder [episodic paroxysmal anxiety] without agoraphobia: Secondary | ICD-10-CM | POA: Diagnosis not present

## 2023-02-03 DIAGNOSIS — F331 Major depressive disorder, recurrent, moderate: Secondary | ICD-10-CM

## 2023-02-03 DIAGNOSIS — K589 Irritable bowel syndrome without diarrhea: Secondary | ICD-10-CM

## 2023-02-03 DIAGNOSIS — F5081 Binge eating disorder: Secondary | ICD-10-CM

## 2023-02-03 DIAGNOSIS — F902 Attention-deficit hyperactivity disorder, combined type: Secondary | ICD-10-CM

## 2023-02-03 MED ORDER — LISDEXAMFETAMINE DIMESYLATE 50 MG PO CAPS: 50.0000 mg | ORAL_CAPSULE | Freq: Every day | ORAL | 0 refills | Status: DC

## 2023-02-03 NOTE — Patient Instructions (Signed)
We increased the Vyvanse to 50 mg once daily today.  When you get a chance please come by our office to get the urine drug screen.
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
## 2023-02-27 DIAGNOSIS — Z79899 Other long term (current) drug therapy: Secondary | ICD-10-CM | POA: Diagnosis not present

## 2023-03-04 ENCOUNTER — Ambulatory Visit (INDEPENDENT_AMBULATORY_CARE_PROVIDER_SITE_OTHER): Payer: Medicaid Other | Admitting: Clinical

## 2023-03-04 DIAGNOSIS — F411 Generalized anxiety disorder: Secondary | ICD-10-CM | POA: Diagnosis not present

## 2023-03-04 DIAGNOSIS — F410 Panic disorder [episodic paroxysmal anxiety] without agoraphobia: Secondary | ICD-10-CM

## 2023-03-04 DIAGNOSIS — F4310 Post-traumatic stress disorder, unspecified: Secondary | ICD-10-CM

## 2023-03-04 DIAGNOSIS — F331 Major depressive disorder, recurrent, moderate: Secondary | ICD-10-CM | POA: Diagnosis not present

## 2023-03-04 DIAGNOSIS — F902 Attention-deficit hyperactivity disorder, combined type: Secondary | ICD-10-CM | POA: Diagnosis not present
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
## 2023-03-13 ENCOUNTER — Encounter (HOSPITAL_COMMUNITY): Payer: Self-pay | Admitting: Psychiatry

## 2023-03-13 ENCOUNTER — Telehealth (HOSPITAL_COMMUNITY): Payer: Medicaid Other | Admitting: Psychiatry

## 2023-03-13 DIAGNOSIS — F4001 Agoraphobia with panic disorder: Secondary | ICD-10-CM | POA: Diagnosis not present

## 2023-03-13 DIAGNOSIS — Z79899 Other long term (current) drug therapy: Secondary | ICD-10-CM | POA: Diagnosis not present

## 2023-03-13 DIAGNOSIS — F4310 Post-traumatic stress disorder, unspecified: Secondary | ICD-10-CM

## 2023-03-13 DIAGNOSIS — F331 Major depressive disorder, recurrent, moderate: Secondary | ICD-10-CM

## 2023-03-13 DIAGNOSIS — F5081 Binge eating disorder: Secondary | ICD-10-CM | POA: Diagnosis not present

## 2023-03-13 DIAGNOSIS — F902 Attention-deficit hyperactivity disorder, combined type: Secondary | ICD-10-CM | POA: Diagnosis not present

## 2023-03-13 DIAGNOSIS — F411 Generalized anxiety disorder: Secondary | ICD-10-CM | POA: Diagnosis not present

## 2023-03-13 DIAGNOSIS — F410 Panic disorder [episodic paroxysmal anxiety] without agoraphobia: Secondary | ICD-10-CM

## 2023-03-13 MED ORDER — LISDEXAMFETAMINE DIMESYLATE 50 MG PO CAPS: 50.0000 mg | ORAL_CAPSULE | Freq: Every day | ORAL | 0 refills | Status: DC

## 2023-03-13 NOTE — Patient Instructions (Signed)
We did not make any medication changes today. I am so sorry for your loss, remember to give yourself permission to grieve.
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
## 2023-03-26 ENCOUNTER — Ambulatory Visit (INDEPENDENT_AMBULATORY_CARE_PROVIDER_SITE_OTHER): Payer: Medicaid Other | Admitting: Adult Health

## 2023-03-26 ENCOUNTER — Other Ambulatory Visit (HOSPITAL_COMMUNITY)
Admission: RE | Admit: 2023-03-26 | Discharge: 2023-03-26 | Disposition: A | Discharge status: Home / Self Care | Payer: Medicaid Other | Source: Ambulatory Visit | Attending: Adult Health | Admitting: Adult Health

## 2023-03-26 ENCOUNTER — Encounter: Payer: Self-pay | Admitting: Adult Health

## 2023-03-26 VITALS — BP 104/79 | HR 88 | Ht 69.5 in | Wt 193.0 lb

## 2023-03-26 DIAGNOSIS — Z113 Encounter for screening for infections with a predominantly sexual mode of transmission: Secondary | ICD-10-CM

## 2023-03-26 DIAGNOSIS — N898 Other specified noninflammatory disorders of vagina: Secondary | ICD-10-CM | POA: Diagnosis present

## 2023-03-26 DIAGNOSIS — B9689 Other specified bacterial agents as the cause of diseases classified elsewhere: Secondary | ICD-10-CM

## 2023-03-26 DIAGNOSIS — N9089 Other specified noninflammatory disorders of vulva and perineum: Secondary | ICD-10-CM | POA: Insufficient documentation

## 2023-03-26 DIAGNOSIS — Z975 Presence of (intrauterine) contraceptive device: Secondary | ICD-10-CM | POA: Diagnosis not present

## 2023-03-26 DIAGNOSIS — N760 Acute vaginitis: Secondary | ICD-10-CM

## 2023-03-26 LAB — POCT WET PREP (WET MOUNT)
Clue Cells Wet Prep Whiff POC: POSITIVE
WBC, Wet Prep HPF POC: POSITIVE

## 2023-03-26 MED ORDER — METRONIDAZOLE 500 MG PO TABS: 500.0000 mg | ORAL_TABLET | Freq: Two times a day (BID) | ORAL | 0 refills | Status: DC

## 2023-03-26 NOTE — Progress Notes (Signed)
  Subjective:     Patient ID: Patty Gomez, female   DOB: April 11, 2001, 22 y.o.   MRN: [id number]  HPI Patty Gomez is a 22 year old white female,single, G0P0, in complaining of vaginal discharge with odor and itching. Has seen some blood in discharge and feels irritated on vulva. Has had sex lately, and used different soap.    Component Value Date/Time   DIAGPAP  01/09/2022 1618    - Negative for intraepithelial lesion or malignancy (NILM)   ADEQPAP  01/09/2022 1618    Satisfactory for evaluation; transformation zone component ABSENT.    PCP is Dr Durwin Nora  Review of Systems +vaginal discharge with odor and itching Has seen some blood in discharge + irritated on vulva. Reviewed past medical,surgical, social and family history. Reviewed medications and allergies.     Objective:   Physical Exam BP 104/79 (BP Location: Left Arm, Patient Position: Sitting, Cuff Size: Normal)   Pulse 88   Ht 5' 9.5" (1.765 m)   Wt 193 lb (87.5 kg)   BMI 28.09 kg/m     Skin warm and dry.Pelvic: external genitalia is normal in appearance,light redness on labia, has shaved, vagina: white discharge with odor,urethra has no lesions or masses noted, cervix:smooth, +IUD strings at os., uterus: normal size, shape and contour, non tender, no masses felt, adnexa: no masses or tenderness noted. Bladder is non tender and no masses felt.  Wet prep: +WBC,and clues cells CV swab obtained.  Upstream - 03/26/23 0956       Pregnancy Intention Screening   Does the patient want to become pregnant in the next year? No    Does the patient's partner want to become pregnant in the next year? No    Would the patient like to discuss contraceptive options today? No      Contraception Wrap Up   Current Method IUD or IUS    End Method IUD or IUS    Contraception Counseling Provided Yes            Examination chaperoned by Malachy Mood LPN  Assessment:    1. Vaginal odor +odor  CV swab sent  - Cervicovaginal ancillary  only( [facility]) - POCT Wet Prep [employer])  2. Vaginal discharge +white discharge CV swab sent  - Cervicovaginal ancillary only( [facility]) - POCT Wet Prep [employer] [place])  3. Itching in the vaginal area - Cervicovaginal ancillary only( [facility]) - POCT Wet Prep [employer])  4. Vulvar irritation Light redness  Use different razor to shave this area  5. IUD (intrauterine device) in place Placed 01/27/18   6. BV (bacterial vaginosis) +clue cells on wet prep Will rx flagyl Meds ordered this encounter  Medications   metroNIDAZOLE (FLAGYL) 500 MG tablet    Sig: Take 1 tablet (500 mg total) by mouth 2 (two) times daily.    Dispense:  14 tablet    Refill:  0    Order Specific Question:   Supervising Provider    Answer:   Lazaro Arms [[id number]]   Use antibacterial soap  - POCT Wet Prep ([facility] [place])  7. Screening for STD (sexually transmitted disease) CV swab sent for GC/CHL,trich,BV and yeast  And will check HIV and RPR - Cervicovaginal ancillary only( [facility]) - HIV Antibody (routine testing w rflx) - RPR     Plan:     Follow up prn

## 2023-03-27 ENCOUNTER — Other Ambulatory Visit: Payer: Self-pay | Admitting: Adult Health

## 2023-03-27 LAB — CERVICOVAGINAL ANCILLARY ONLY
Bacterial Vaginitis (gardnerella): POSITIVE — AB
Candida Glabrata: NEGATIVE
Candida Vaginitis: NEGATIVE
Chlamydia: NEGATIVE
Comment: NEGATIVE
Comment: NEGATIVE
Comment: NEGATIVE
Comment: NEGATIVE
Comment: NEGATIVE
Comment: NORMAL
Neisseria Gonorrhea: NEGATIVE
Trichomonas: NEGATIVE

## 2023-03-27 LAB — RPR: RPR Ser Ql: NONREACTIVE

## 2023-03-27 LAB — HIV ANTIBODY (ROUTINE TESTING W REFLEX): HIV Screen 4th Generation wRfx: NONREACTIVE

## 2023-03-27 MED ORDER — METRONIDAZOLE 0.75 % VA GEL: 1.0000 | Freq: Every day | VAGINAL | 0 refills | Status: DC

## 2023-03-27 NOTE — Progress Notes (Signed)
Rx metrogel  

## 2023-04-08 ENCOUNTER — Ambulatory Visit (INDEPENDENT_AMBULATORY_CARE_PROVIDER_SITE_OTHER): Payer: Medicaid Other | Admitting: Clinical

## 2023-04-08 DIAGNOSIS — F902 Attention-deficit hyperactivity disorder, combined type: Secondary | ICD-10-CM

## 2023-04-08 DIAGNOSIS — F331 Major depressive disorder, recurrent, moderate: Secondary | ICD-10-CM | POA: Diagnosis not present

## 2023-04-08 DIAGNOSIS — F4310 Post-traumatic stress disorder, unspecified: Secondary | ICD-10-CM

## 2023-04-08 DIAGNOSIS — F410 Panic disorder [episodic paroxysmal anxiety] without agoraphobia: Secondary | ICD-10-CM

## 2023-04-08 DIAGNOSIS — F411 Generalized anxiety disorder: Secondary | ICD-10-CM
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
## 2023-04-10 ENCOUNTER — Encounter (HOSPITAL_COMMUNITY): Payer: Self-pay | Admitting: Psychiatry

## 2023-04-10 ENCOUNTER — Telehealth (HOSPITAL_COMMUNITY): Payer: Medicaid Other | Admitting: Psychiatry

## 2023-04-10 DIAGNOSIS — F4001 Agoraphobia with panic disorder: Secondary | ICD-10-CM

## 2023-04-10 DIAGNOSIS — F902 Attention-deficit hyperactivity disorder, combined type: Secondary | ICD-10-CM | POA: Diagnosis not present

## 2023-04-10 DIAGNOSIS — F410 Panic disorder [episodic paroxysmal anxiety] without agoraphobia: Secondary | ICD-10-CM

## 2023-04-10 DIAGNOSIS — F331 Major depressive disorder, recurrent, moderate: Secondary | ICD-10-CM

## 2023-04-10 DIAGNOSIS — F411 Generalized anxiety disorder: Secondary | ICD-10-CM

## 2023-04-10 DIAGNOSIS — Z79899 Other long term (current) drug therapy: Secondary | ICD-10-CM

## 2023-04-10 DIAGNOSIS — F50811 Binge eating disorder, moderate: Secondary | ICD-10-CM

## 2023-04-10 DIAGNOSIS — F4310 Post-traumatic stress disorder, unspecified: Secondary | ICD-10-CM

## 2023-04-10 DIAGNOSIS — F17290 Nicotine dependence, other tobacco product, uncomplicated: Secondary | ICD-10-CM

## 2023-04-10 MED ORDER — LISDEXAMFETAMINE DIMESYLATE 70 MG PO CAPS: 70.0000 mg | ORAL_CAPSULE | Freq: Every day | ORAL | 0 refills | Status: DC
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
## 2023-04-10 NOTE — Patient Instructions (Signed)
We increased the Vyvanse to 70 mg once daily today.  This should help with some of the impulsivity if there is to binge eating but one of the biggest health will be having more structure to your day and meals more generally.  Try to find activities that will occupy her hands to avoid boredom eating.

## 2023-04-16 DIAGNOSIS — F4310 Post-traumatic stress disorder, unspecified: Secondary | ICD-10-CM | POA: Diagnosis not present

## 2023-04-28 ENCOUNTER — Encounter: Payer: Self-pay | Admitting: Adult Health

## 2023-04-28 ENCOUNTER — Ambulatory Visit: Payer: Medicaid Other | Admitting: Adult Health

## 2023-04-28 VITALS — BP 109/63 | HR 68 | Ht 69.5 in | Wt 199.8 lb

## 2023-04-28 DIAGNOSIS — Z01419 Encounter for gynecological examination (general) (routine) without abnormal findings: Secondary | ICD-10-CM | POA: Diagnosis not present

## 2023-04-28 DIAGNOSIS — Z975 Presence of (intrauterine) contraceptive device: Secondary | ICD-10-CM

## 2023-04-28 DIAGNOSIS — Z1331 Encounter for screening for depression: Secondary | ICD-10-CM | POA: Diagnosis not present

## 2023-04-28 NOTE — Progress Notes (Signed)
Patient ID: Patty Gomez, female   DOB: [birth year], 22 y.o.   MRN: [id number] History of Present Illness: Patty Gomez is a 22 year old white female,single, G0P0, in for a well woman gyn exam.     Component Value Date/Time   DIAGPAP  01/09/2022 1618    - Negative for intraepithelial lesion or malignancy (NILM)   ADEQPAP  01/09/2022 1618    Satisfactory for evaluation; transformation zone component ABSENT.   PCP is Dr Durwin Nora   Current Medications, Allergies, Past Medical History, Past Surgical History, Family History and Social History were reviewed in [employer] electronic medical record.     Review of Systems: Patient denies any headaches, hearing loss, fatigue, blurred vision, shortness of breath, chest pain, abdominal pain, problems with bowel movements, urination, or intercourse. No joint pain or mood swings.  No periods with IUD    Physical Exam:BP 109/63 (BP Location: Right Arm, Patient Position: Sitting, Cuff Size: Normal)   Pulse 68   Ht 5' 9.5" (1.765 m)   Wt 199 lb 12.8 oz (90.6 kg)   BMI 29.08 kg/m   General:  Well developed, well nourished, no acute distress Skin:  Warm and dry Neck:  Midline trachea, normal thyroid, good ROM, no lymphadenopathy Lungs; Clear to auscultation bilaterally Breast:  No dominant palpable mass, retraction, or nipple discharge Cardiovascular: Regular rate and rhythm Abdomen:  Soft, non tender, no hepatosplenomegaly Pelvic:  External genitalia is normal in appearance, no lesions.  The vagina is normal in appearance. Urethra has no lesions or masses. The cervix is non tender and pink, IUD strings at os, short.  Uterus is felt to be normal size, shape, and contour.  No adnexal masses or tenderness noted.Bladder is non tender, no masses felt Extremities/musculoskeletal:  No swelling or varicosities noted, no clubbing or cyanosis Psych:  No mood changes, alert and cooperative,seems happy AA is 1 Fall risk is low    04/28/2023    9:52 AM 01/17/2023     1:19 PM 10/17/2022    1:07 PM  Depression screen PHQ 2/9  Decreased Interest 0 0 3  Down, Depressed, Hopeless 0 0 2  PHQ - 2 Score 0 0 5  Altered sleeping 0 0 3  Tired, decreased energy 0 0 3  Change in appetite 0 0 3  Feeling bad or failure about yourself  0 0 1  Trouble concentrating 0 0 0  Moving slowly or fidgety/restless 0 0 3  Suicidal thoughts 0 0 0  PHQ-9 Score 0 0 18       04/28/2023    9:53 AM 01/17/2023    1:20 PM 10/17/2022    1:08 PM 07/19/2022    4:05 PM  GAD 7 : Generalized Anxiety Score  Nervous, Anxious, on Edge 0 2 1 0  Control/stop worrying 0 3 3 0  Worry too much - different things 0 3 3 0  Trouble relaxing 0 2 2 0  Restless 0 2 3 0  Easily annoyed or irritable 0 2 3 0  Afraid - awful might happen 0 1 1 0  Total GAD 7 Score 0 15 16 0    Upstream - 04/28/23 0951       Pregnancy Intention Screening   Does the patient want to become pregnant in the next year? No    Does the patient's partner want to become pregnant in the next year? No    Would the patient like to discuss contraceptive options today? No  Contraception Wrap Up   Current Method IUD or IUS    End Method IUD or IUS    Contraception Counseling Provided No    How was the end contraceptive method provided? N/A            Examination chaperoned by Faith Rogue LPN    Impression and plan: 1. Encounter for well woman exam with routine gynecological exam Physical in 1 year Pap 2026 Labs with PCP Stay active   2. IUD (intrauterine device) in place Liletta placed 01/27/18

## 2023-05-08 ENCOUNTER — Telehealth (HOSPITAL_COMMUNITY): Payer: Medicaid Other | Admitting: Psychiatry

## 2023-05-13 ENCOUNTER — Ambulatory Visit (HOSPITAL_COMMUNITY): Payer: Medicaid Other | Admitting: Clinical

## 2023-05-13 DIAGNOSIS — F4310 Post-traumatic stress disorder, unspecified: Secondary | ICD-10-CM

## 2023-05-13 DIAGNOSIS — F411 Generalized anxiety disorder: Secondary | ICD-10-CM

## 2023-05-13 DIAGNOSIS — F410 Panic disorder [episodic paroxysmal anxiety] without agoraphobia: Secondary | ICD-10-CM | POA: Diagnosis not present

## 2023-05-13 DIAGNOSIS — F902 Attention-deficit hyperactivity disorder, combined type: Secondary | ICD-10-CM
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
## 2023-05-26 ENCOUNTER — Ambulatory Visit (INDEPENDENT_AMBULATORY_CARE_PROVIDER_SITE_OTHER): Payer: Medicaid Other | Admitting: Clinical

## 2023-05-26 DIAGNOSIS — F411 Generalized anxiety disorder: Secondary | ICD-10-CM

## 2023-05-26 DIAGNOSIS — F4310 Post-traumatic stress disorder, unspecified: Secondary | ICD-10-CM | POA: Diagnosis not present

## 2023-05-26 DIAGNOSIS — F331 Major depressive disorder, recurrent, moderate: Secondary | ICD-10-CM

## 2023-05-26 DIAGNOSIS — F902 Attention-deficit hyperactivity disorder, combined type: Secondary | ICD-10-CM

## 2023-05-26 DIAGNOSIS — F410 Panic disorder [episodic paroxysmal anxiety] without agoraphobia: Secondary | ICD-10-CM | POA: Diagnosis not present

## 2023-05-26 NOTE — Progress Notes (Signed)
Virtual Visit via Video Note   I connected with Patty Gomez on 05/26/23 at  2:00 PM EST by a video enabled telemedicine application and verified that I am speaking with the correct person using two identifiers.   Location: Patient: Home Provider: Office   I discussed the limitations of evaluation and management by telemedicine and the availability of in person appointments. The patient expressed understanding and agreed to proceed.   THERAPY PROGRESS NOTE   Session Time: 2:00 PM- 2:30 PM   Participation Level: Active   Behavioral Response: CasualAlert/Hyperverbal   Type of Therapy: Individual Therapy   Treatment Goals addressed: Coping   Interventions: CBT, DBT, Solution Focused, Strength-based and Supportive   Summary: Patty Gomez is a 22y.o. female who presents with Depression, Anxiety, PTSD, ADHD . The OPT therapist worked with the patient for her OPT treatment session. The OPT therapist utilized Motivational Interviewing to assist in creating therapeutic repore. The patient in the session was engaged and work in collaboration giving feedback about her triggers and symptoms over the past few weeks. The patient spoke about her Thanksgiving and family interactions including getting some of her deceased brothers ashes. The patients spoke about her court hearing and the patients case was continued to Jan 21st of next year and the patient will have to do 30 days in rehab and will be starting this tomorrow. The OPT therapist worked with the patient in a exercise in session to identify and challenge (ANTS) and implement positive self mantra.The OPT therapist worked with the patient on her impulsivity and decision making , as well as accountability during the session examining her behavior vs her individual goals. The patient will be in rehab for the next 30days and will follow up for OPT in January between release of rehab and her next court hearing. The patient spoke about her feelings about  the 30 days rehab with mixed emotions both nervous buy also noted she feel this is something she knows she needs to get completed and get back on track and feel normal again.   Suicidal/Homicidal: Nowithout intent/plan   Therapist Response:The OPT therapist worked with the patient for the patients scheduled session. The patient was engaged in her session and gave feedback in relation to triggers, symptoms, and behavior responses over the past few weeks. The patient spoke about her recent Thanksgiving and her recent court hearing. The patient will be doing a 30 day rehab before her next court hearing Jan 21st next year.  The OPT therapist worked with the patient utilizing an in session Cognitive Behavioral Therapy exercise. The patient was responsive in the session and verbalized, " I am nervous about serve 30 days in rehab but I also know this is what I need to get back in the right direction in my life".  The OPT therapist worked with the patient overviewing basic health areas including sleep cycle, eating habits, hygiene, and physical exercise. The patient in this session verbalized no current S/I or H/I.  The OPT therapist will continue treatment work with the patient in her next scheduled session.   Plan: Return again in 2/3 weeks.   Diagnosis:      Axis I:Depression GAD PTSD ADHD                           Axis II: No diagnosis       Collaboration of Care: Overview of medication management program involvement with psychiatrist Dr. Tenny Craw.   Patient/Guardian  was advised Release of Information must be obtained prior to any record release in order to collaborate their care with an outside provider. Patient/Guardian was advised if they have not already done so to contact the registration department to sign all necessary forms in order for [place] to release information regarding their care.    Consent: Patient/Guardian gives verbal consent for treatment and assignment of benefits for services provided  during this visit. Patient/Guardian expressed understanding and agreed to proceed.    I discussed the assessment and treatment plan with the patient. The patient was provided an opportunity to ask questions and all were answered. The patient agreed with the plan and demonstrated an understanding of the instructions.   The patient was advised to call back or seek an in-person evaluation if the symptoms worsen or if the condition fails to improve as anticipated.   I provided 30 minutes of non-face-to-face time during this encounter.   Suzan Garibaldi, LCSW   05/26/2023

## 2023-06-06 DIAGNOSIS — F329 Major depressive disorder, single episode, unspecified: Secondary | ICD-10-CM | POA: Diagnosis not present

## 2023-06-16 DIAGNOSIS — F4310 Post-traumatic stress disorder, unspecified: Secondary | ICD-10-CM | POA: Diagnosis not present

## 2023-06-30 ENCOUNTER — Other Ambulatory Visit: Payer: Self-pay

## 2023-06-30 ENCOUNTER — Telehealth: Payer: Self-pay | Admitting: Internal Medicine

## 2023-06-30 DIAGNOSIS — F902 Attention-deficit hyperactivity disorder, combined type: Secondary | ICD-10-CM

## 2023-06-30 NOTE — Telephone Encounter (Signed)
 Copied from CRM [id number]. Topic: Referral - Request for Referral >> Jun 30, 2023  3:46 PM Patty Gomez wrote: Patient was seeing Dr. Jayson Peel (psychiatrist) but he will no longer see the patient because she was missing her appointments. Patient's grandmother called to request a new referral be sent to another psychiatrist in the area or can even do [place] or [place], preferably one that prescribes medication and will actually work with the patient.

## 2023-07-01 ENCOUNTER — Ambulatory Visit (HOSPITAL_COMMUNITY): Payer: Medicaid Other | Admitting: Clinical

## 2023-07-01 DIAGNOSIS — F4310 Post-traumatic stress disorder, unspecified: Secondary | ICD-10-CM | POA: Diagnosis not present

## 2023-07-01 DIAGNOSIS — F902 Attention-deficit hyperactivity disorder, combined type: Secondary | ICD-10-CM | POA: Diagnosis not present

## 2023-07-01 DIAGNOSIS — F411 Generalized anxiety disorder: Secondary | ICD-10-CM

## 2023-07-01 DIAGNOSIS — F331 Major depressive disorder, recurrent, moderate: Secondary | ICD-10-CM

## 2023-07-01 DIAGNOSIS — F410 Panic disorder [episodic paroxysmal anxiety] without agoraphobia: Secondary | ICD-10-CM

## 2023-07-01 NOTE — Progress Notes (Signed)
 Virtual Visit via Video Note   I connected with Patty Gomez on 07/01/23 at  2:00 PM EST by a video enabled telemedicine application and verified that I am speaking with the correct person using two identifiers.   Location: Patient: Home Provider: Office   I discussed the limitations of evaluation and management by telemedicine and the availability of in person appointments. The patient expressed understanding and agreed to proceed.   THERAPY PROGRESS NOTE   Session Time: 2:00 PM- 2:30 PM   Participation Level: Active   Behavioral Response: CasualAlert/Hyperverbal   Type of Therapy: Individual Therapy   Treatment Goals addressed: Coping   Interventions: CBT, DBT, Solution Focused, Strength-based and Supportive   Summary: Patty Gomez is a 23y.o. female who presents with Depression, Anxiety, PTSD, ADHD . The OPT therapist worked with the patient for her OPT treatment session. The OPT therapist utilized Motivational Interviewing to assist in creating therapeutic repore. The patient in the session was engaged and work in collaboration giving feedback about her triggers and symptoms over the past few weeks. The patient spoke about her difficulty with getting into the court ordered rehab. The patients spoke about her  upcoming court hearing Jan 21st and concern she still has not got her requirements met.  The patient noted that she has continued to search for a med management provider and being off of her medication has caused her to have ongoing difficulty managing her MH. The patient additionally spoke about her difficulty with grieving in the recent passing of her brother who was killed. The OPT therapist worked with the patient in a exercise in session to identify and challenge (ANTS) and implement positive self mantra.The OPT therapist worked with the patient on her impulsivity and decision making , as well as accountability during the session examining her behavior vs her individual goals.  The patient has not started her 30 days of rehab and has a court hearing next  week.   Suicidal/Homicidal: Nowithout intent/plan   Therapist Response:The OPT therapist worked with the patient for the patients scheduled session. The patient was engaged in her session and gave feedback in relation to triggers, symptoms, and behavior responses over the past few weeks. The patient did not complete or start her 30 day rehab and does have a court hearing next week Jan 21st.  The OPT therapist worked with the patient utilizing an in session Cognitive Behavioral Therapy exercise. The patient was responsive in the session and verbalized,  I still did not get into the rehab and I did think I had a appointment for psychiatrist and my medication but I went when they gave me an appointment to place in [place] and they were not even open so It didn't work out.  The OPT therapist worked with the patient overviewing basic health areas including sleep cycle, eating habits, hygiene, and physical exercise. The patient in this session verbalized no current S/I or H/I. The OPT therapist worked with the patient on planning and working on her goals. The OPT therapist will continue treatment work with the patient in her next scheduled session.   Plan: Return again in 2/3 weeks.   Diagnosis:      Axis I:Depression GAD PTSD ADHD                           Axis II: No diagnosis       Collaboration of Care: Overview of medication management program involvement with psychiatrist Dr. Okey.  Patient/Guardian was advised Release of Information must be obtained prior to any record release in order to collaborate their care with an outside provider. Patient/Guardian was advised if they have not already done so to contact the registration department to sign all necessary forms in order for us  to release information regarding their care.    Consent: Patient/Guardian gives verbal consent for treatment and assignment of benefits for  services provided during this visit. Patient/Guardian expressed understanding and agreed to proceed.    I discussed the assessment and treatment plan with the patient. The patient was provided an opportunity to ask questions and all were answered. The patient agreed with the plan and demonstrated an understanding of the instructions.   The patient was advised to call back or seek an in-person evaluation if the symptoms worsen or if the condition fails to improve as anticipated.   I provided 30 minutes of non-face-to-face time during this encounter.   Jerel Pepper, LCSW   07/01/2023

## 2023-07-29 ENCOUNTER — Ambulatory Visit (INDEPENDENT_AMBULATORY_CARE_PROVIDER_SITE_OTHER): Payer: Medicaid Other | Admitting: Clinical

## 2023-07-29 DIAGNOSIS — F411 Generalized anxiety disorder: Secondary | ICD-10-CM | POA: Diagnosis not present

## 2023-07-29 DIAGNOSIS — F410 Panic disorder [episodic paroxysmal anxiety] without agoraphobia: Secondary | ICD-10-CM

## 2023-07-29 DIAGNOSIS — F4310 Post-traumatic stress disorder, unspecified: Secondary | ICD-10-CM

## 2023-07-29 DIAGNOSIS — F902 Attention-deficit hyperactivity disorder, combined type: Secondary | ICD-10-CM

## 2023-07-29 DIAGNOSIS — F331 Major depressive disorder, recurrent, moderate: Secondary | ICD-10-CM

## 2023-07-29 NOTE — Progress Notes (Signed)
Virtual Visit via Video Note   I connected with Patty Gomez on 07/29/23 at  2:00 PM EST by a video enabled telemedicine application and verified that I am speaking with the correct person using two identifiers.   Location: Patient: Home Provider: Office   I discussed the limitations of evaluation and management by telemedicine and the availability of in person appointments. The patient expressed understanding and agreed to proceed.   THERAPY PROGRESS NOTE   Session Time: 2:00 PM- 2:30 PM   Participation Level: Active   Behavioral Response: CasualAlert/Hyperverbal   Type of Therapy: Individual Therapy   Treatment Goals addressed: Coping   Interventions: CBT, DBT, Solution Focused, Strength-based and Supportive   Summary: Patty Gomez is a 23y.o. female who presents with Depression, Anxiety, PTSD, ADHD . The OPT therapist worked with the patient for her OPT treatment session. The OPT therapist utilized Motivational Interviewing to assist in creating therapeutic repore. The patient in the session was engaged and work in collaboration giving feedback about her triggers and symptoms over the past few weeks. The patient spoke about just leaving court prior to the OPT appointment and taking with her probation assigned officer while at court.The patient noted that she has continued to search for a med management provider and being off of her medication has caused her to have ongoing difficulty managing her MH and she has got a list of providers and was initially going to go to [facility], however, post talking with her probation officer she has gotten a list of other providers she is going to look into. The patient spoke about the stress of going to court and she is assigned to a check in with her probation office on a weekly basis.The OPT therapist worked with the patient on her impulsivity and decision making , as well as accountability during the session examining her behavior vs her individual  goals.    Suicidal/Homicidal: Nowithout intent/plan   Therapist Response:The OPT therapist worked with the patient for the patients scheduled session. The patient was engaged in her session and gave feedback in relation to triggers, symptoms, and behavior responses over the past few weeks. The patient prior to session just came from court and meeting with her probation office.  The OPT therapist worked with the patient utilizing an in session Cognitive Behavioral Therapy exercise. The patient was responsive in the session and verbalized, " I am going to have to meet with my probation office each week and they will be coming to the home".  The OPT therapist worked with the patient overviewing basic health areas including sleep cycle, eating habits, hygiene, and physical exercise. The patient in this session verbalized no current S/I or H/I. The OPT therapist worked with the patient on planning and working on her goals.  The patient agreed getting consistent in her work with a psychiatrist is a central part of her treatment moving forward to help her to slow down her thoughts and focus. The OPT therapist will continue treatment work with the patient in her next scheduled session.   Plan: Return again in 2/3 weeks.   Diagnosis:      Axis I:Depression GAD PTSD ADHD                           Axis II: No diagnosis       Collaboration of Care: Overview of medication management program involvement with psychiatrist Dr. Tenny Craw.   Patient/Guardian was advised Release of Information  must be obtained prior to any record release in order to collaborate their care with an outside provider. Patient/Guardian was advised if they have not already done so to contact the registration department to sign all necessary forms in order for [place] to release information regarding their care.    Consent: Patient/Guardian gives verbal consent for treatment and assignment of benefits for services provided during this visit.  Patient/Guardian expressed understanding and agreed to proceed.    I discussed the assessment and treatment plan with the patient. The patient was provided an opportunity to ask questions and all were answered. The patient agreed with the plan and demonstrated an understanding of the instructions.   The patient was advised to call back or seek an in-person evaluation if the symptoms worsen or if the condition fails to improve as anticipated.   I provided 30 minutes of non-face-to-face time during this encounter.   Suzan Garibaldi, LCSW   07/29/2023

## 2023-07-30 ENCOUNTER — Telehealth: Payer: Self-pay | Admitting: Internal Medicine

## 2023-07-30 NOTE — Telephone Encounter (Signed)
Referral has been redirected to [facility] as Patient requested.

## 2023-07-30 NOTE — Telephone Encounter (Signed)
Copied from CRM [id number]. Topic: Referral - Request for Referral >> Jul 30, 2023 10:26 AM Phill Myron wrote: Kris Mouton mother and patient calling stating that [facility] said they do not accept medicaid, so patient is asking for a referral to [facility] in [place]..[phone]

## 2023-08-01 ENCOUNTER — Encounter: Payer: Self-pay | Admitting: Internal Medicine

## 2023-08-01 ENCOUNTER — Ambulatory Visit: Payer: Self-pay | Admitting: Internal Medicine

## 2023-08-01 VITALS — BP 112/76 | HR 89 | Ht 70.0 in | Wt 187.0 lb

## 2023-08-01 DIAGNOSIS — R1013 Epigastric pain: Secondary | ICD-10-CM

## 2023-08-01 DIAGNOSIS — R109 Unspecified abdominal pain: Secondary | ICD-10-CM | POA: Diagnosis not present

## 2023-08-01 DIAGNOSIS — R112 Nausea with vomiting, unspecified: Secondary | ICD-10-CM | POA: Diagnosis not present

## 2023-08-01 DIAGNOSIS — R1084 Generalized abdominal pain: Secondary | ICD-10-CM | POA: Insufficient documentation

## 2023-08-01 MED ORDER — PANTOPRAZOLE SODIUM 40 MG PO TBEC: 40.0000 mg | DELAYED_RELEASE_TABLET | Freq: Two times a day (BID) | ORAL | 2 refills | Status: DC

## 2023-08-01 MED ORDER — ONDANSETRON HCL 4 MG PO TABS: 4.0000 mg | ORAL_TABLET | Freq: Three times a day (TID) | ORAL | 0 refills | Status: DC | PRN

## 2023-08-01 MED ORDER — DICYCLOMINE HCL 10 MG PO CAPS: 10.0000 mg | ORAL_CAPSULE | Freq: Three times a day (TID) | ORAL | 1 refills | Status: DC

## 2023-08-01 NOTE — Progress Notes (Signed)
Acute Office Visit  Subjective:     Patient ID: Patty Gomez, female    DOB: 2000-12-25, 23 y.o.   MRN: [id number]  Chief Complaint  Patient presents with   Abdominal Pain    Pain, vomiting and inflammation    Patty Gomez presents today for an acute visit endorsing generalized abdominal pain with vomiting.  She has a chronic history of abdominal pain.  Last evaluated by gastroenterology in March 2024.  She underwent EGD, which showed gastritis.  She also completed a HIDA scan that was normal.  Treated with Protonix with symptomatic improvement.  Most recently, she endorses a 1 month history of generalized abdominal pain.  Symptoms are worse at night when laying on her stomach.  She describes a burning sensation and nausea with vomiting.  She has attempted to change her diet and avoid foods known to trigger her symptoms.  Symptoms have appreciably worsened within the last 3 days.  She contacted gastroenterology to schedule appointment and will be seen on 3/5.  Review of Systems  Gastrointestinal:  Positive for abdominal pain, nausea and vomiting.      Objective:    BP 112/76 (BP Location: Left Arm, Patient Position: Sitting, Cuff Size: Normal)   Pulse 89   Ht 5\' 10"  (1.778 m)   Wt 187 lb (84.8 kg)   SpO2 97%   BMI 26.83 kg/m   Physical Exam Vitals reviewed.  Constitutional:      General: She is not in acute distress.    Appearance: Normal appearance. She is not toxic-appearing.  HENT:     Head: Normocephalic and atraumatic.     Right Ear: External ear normal.     Left Ear: External ear normal.     Nose: Nose normal. No congestion or rhinorrhea.     Mouth/Throat:     Mouth: Mucous membranes are moist.     Pharynx: Oropharynx is clear. No oropharyngeal exudate or posterior oropharyngeal erythema.  Eyes:     General: No scleral icterus.    Extraocular Movements: Extraocular movements intact.     Conjunctiva/sclera: Conjunctivae normal.     Pupils: Pupils are equal, round,  and reactive to light.  Cardiovascular:     Rate and Rhythm: Normal rate and regular rhythm.     Pulses: Normal pulses.     Heart sounds: Normal heart sounds. No murmur heard.    No friction rub. No gallop.  Pulmonary:     Effort: Pulmonary effort is normal.     Breath sounds: Normal breath sounds. No wheezing, rhonchi or rales.  Abdominal:     General: Abdomen is flat. Bowel sounds are normal. There is no distension.     Palpations: Abdomen is soft.     Tenderness: There is abdominal tenderness (Diffusely tender to light palpation).  Musculoskeletal:        General: No swelling. Normal range of motion.     Cervical back: Normal range of motion.     Right lower leg: No edema.     Left lower leg: No edema.  Lymphadenopathy:     Cervical: No cervical adenopathy.  Skin:    General: Skin is warm and dry.     Capillary Refill: Capillary refill takes less than 2 seconds.     Coloration: Skin is not jaundiced.  Neurological:     General: No focal deficit present.     Mental Status: She is alert and oriented to person, place, and time.  Psychiatric:  Mood and Affect: Mood normal.        Behavior: Behavior normal.       Assessment & Plan:   Problem List Items Addressed This Visit       Generalized abdominal pain   Presenting today for an acute visit endorsing acute on chronic generalized abdominal pain.  Symptoms have appreciably worsened over the last month and particularly the last 3 days.  She describes abdominal pain that worsens at night when laying on her stomach.  She experiences associated nausea with vomiting.  She has previously endorsed similar symptoms and was evaluated by gastroenterology, last in March 2024.  Underwent EGD revealing gastritis.  Normal HIDA scan.  Symptoms improved with Protonix 40 mg twice daily. -Treatment options reviewed today.  Will resume Protonix 40 mg twice daily given previous symptom relief.  Zofran and Bentyl added for as needed relief of  nausea and abdominal cramping respectively.  Gastroenterology appointment is scheduled for 3/5.  Will arrange routine follow-up in 6 weeks.      Meds ordered this encounter  Medications   dicyclomine (BENTYL) 10 MG capsule    Sig: Take 1 capsule (10 mg total) by mouth 4 (four) times daily -  before meals and at bedtime.    Dispense:  30 capsule    Refill:  1   pantoprazole (PROTONIX) 40 MG tablet    Sig: Take 1 tablet (40 mg total) by mouth 2 (two) times daily.    Dispense:  60 tablet    Refill:  2   ondansetron (ZOFRAN) 4 MG tablet    Sig: Take 1 tablet (4 mg total) by mouth every 8 (eight) hours as needed for nausea or vomiting.    Dispense:  20 tablet    Refill:  0    Return in about 6 weeks (around 09/12/2023).  Billie Lade, MD

## 2023-08-01 NOTE — Assessment & Plan Note (Signed)
Presenting today for an acute visit endorsing acute on chronic generalized abdominal pain.  Symptoms have appreciably worsened over the last month and particularly the last 3 days.  She describes abdominal pain that worsens at night when laying on her stomach.  She experiences associated nausea with vomiting.  She has previously endorsed similar symptoms and was evaluated by gastroenterology, last in March 2024.  Underwent EGD revealing gastritis.  Normal HIDA scan.  Symptoms improved with Protonix 40 mg twice daily. -Treatment options reviewed today.  Will resume Protonix 40 mg twice daily given previous symptom relief.  Zofran and Bentyl added for as needed relief of nausea and abdominal cramping respectively.  Gastroenterology appointment is scheduled for 3/5.  Will arrange routine follow-up in 6 weeks.

## 2023-08-01 NOTE — Patient Instructions (Signed)
It was a pleasure to see you today.  Thank you for giving [place] the opportunity to be involved in your care.  Below is a brief recap of your visit and next steps.  We will plan to see you again in 6 weeks.  Summary Resume Protonix 40 mg twice daily Zofran and Bentyl as needed GI appointment 3/5  Follow up in 6 weeks

## 2023-08-04 DIAGNOSIS — Z720 Tobacco use: Secondary | ICD-10-CM | POA: Diagnosis not present

## 2023-08-05 DIAGNOSIS — Z0389 Encounter for observation for other suspected diseases and conditions ruled out: Secondary | ICD-10-CM | POA: Diagnosis not present

## 2023-08-20 ENCOUNTER — Encounter: Payer: Self-pay | Admitting: Gastroenterology

## 2023-08-20 ENCOUNTER — Ambulatory Visit (INDEPENDENT_AMBULATORY_CARE_PROVIDER_SITE_OTHER): Payer: Medicaid Other | Admitting: Gastroenterology

## 2023-08-20 VITALS — BP 102/71 | HR 102 | Temp 98.6°F | Ht 70.0 in | Wt 190.4 lb

## 2023-08-20 DIAGNOSIS — K219 Gastro-esophageal reflux disease without esophagitis: Secondary | ICD-10-CM

## 2023-08-20 DIAGNOSIS — R109 Unspecified abdominal pain: Secondary | ICD-10-CM

## 2023-08-20 DIAGNOSIS — R1013 Epigastric pain: Secondary | ICD-10-CM

## 2023-08-20 MED ORDER — AMITRIPTYLINE HCL 25 MG PO TABS: 25.0000 mg | ORAL_TABLET | Freq: Every day | ORAL | 3 refills | Status: DC

## 2023-08-20 NOTE — Progress Notes (Signed)
 Gastroenterology Office Note     Primary Care Physician:  Billie Lade, MD  Primary Gastroenterologist: Dr. Marletta Lor    Chief Complaint   Chief Complaint  Patient presents with   Follow-up    Follow up abd pain off and on.     History of Present Illness   Patty Gomez is a 23 y.o. female presenting today with a history of chronic abdominal pain, N/V with concerns for cyclical vomiting syndrome, diarrhea, evaluated by [facility] in 2020 and referred to [facility]. She has seen other GI providers in the interim but is keeping care here currently. Last seen in Feb 2024.   Returns today with her grandmother. She notes she has improved since last seen. She had a traumatic event with losing her brother unexpectedly since last seen. She is in tune with her physical symptoms and feels stress and anxiety significantly affects how she feels. When she takes pantoprazole BID, she has good control of GERD, N/V. She will take dicyclomine as needed. Sometimes diarrhea. She no-showed with psych several times and was discharged from the practice. She verbalizes now that she knows it's important to call and cancel appointments instead of no-showing. She has been involved in group therapy with excellent results. In the past, she has taken amitriptyline and does note improvement with this with abdominal pain and diarrhea, as she has started taking an old prescription again. She is requesting a new prescription.   Prior evaluations: Feb 2020: negative ultrasound   EGD Nov 2020:  MILD NSAID Gastritis. Biopsied. Negative    Dec 2020: negative GES   CT abd/pelvis without contrast in Oct 2023: unrevealing except for possible functional right ovarian cyst.    06/07/19 CTE 1.  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 No discrete lead points are identified. No associated small bowel obstruction.  Attention on follow-up as clinically warranted. 2.  No evidence of active bowel inflammation.    2021: celiac serologies negative  Aug 2021 normal HBT per [facility]  Flex sig 2022 at [facility] with internal hemorrhoids s/p bandin gX 2   2024: extensive serologies (CRP, sed rate, ANA, alpha gal panel negative). Ferritin normal 102, no IDA  March 2024 normal HIDA with EF 54%  March 2024: normal esophagus s/p biopsy, gastritis s/p biopsy, no EOE, no H. Pylori.     Past Medical History:  Diagnosis Date   Acute bronchitis 01/20/2013   Anxiety state 08/22/2015   Depression    Encounter for IUD insertion 01/27/2018   Hair loss 03/11/2022   Iron deficiency 05/15/2022   Migraines    Pain and swelling of toe of left foot 02/11/2022   Preventative health care 02/11/2022   Screening examination for STD (sexually transmitted disease) 03/20/2021   Trichimoniasis    Vaginal burning 03/20/2021   Vaginal discharge 03/20/2021   Vaginal irritation 06/04/2021    Past Surgical History:  Procedure Laterality Date   BIOPSY  05/07/2019   Procedure: BIOPSY;  Surgeon: West Bali, MD;  [facility]: [facility];  Service: Endoscopy;;   BIOPSY  08/23/2022   Procedure: BIOPSY;  Surgeon: Lanelle Bal, DO;  Location: [facility];  Service: Endoscopy;;   COLONOSCOPY     ESOPHAGOGASTRODUODENOSCOPY N/A 05/07/2019   Procedure: ESOPHAGOGASTRODUODENOSCOPY (EGD);  Surgeon: West Bali, MD;  Location: [facility];  Service: Endoscopy;  Laterality: N/A;  3:00pm   ESOPHAGOGASTRODUODENOSCOPY (EGD) WITH  PROPOFOL N/A 08/23/2022   Procedure: ESOPHAGOGASTRODUODENOSCOPY (EGD) WITH PROPOFOL;  Surgeon: Lanelle Bal, DO;  Location: [facility];  Service: Endoscopy;  Laterality: N/A;  1:15 pm ,asa 2   TONSILLECTOMY      Current Outpatient Medications  Medication Sig Dispense Refill   amitriptyline (ELAVIL) 25 MG tablet Take 1 tablet (25 mg total) by mouth at bedtime. 30 tablet 3    dicyclomine (BENTYL) 10 MG capsule Take 1 capsule (10 mg total) by mouth 4 (four) times daily -  before meals and at bedtime. 30 capsule 1   levonorgestrel (LILETTA) 19.5 MCG/DAY IUD IUD 1 each by Intrauterine route once.     ondansetron (ZOFRAN) 4 MG tablet Take 1 tablet (4 mg total) by mouth every 8 (eight) hours as needed for nausea or vomiting. 20 tablet 0   pantoprazole (PROTONIX) 40 MG tablet Take 1 tablet (40 mg total) by mouth 2 (two) times daily. 60 tablet 2   No current facility-administered medications for this visit.    Allergies as of 08/20/2023 - Review Complete 08/20/2023  Allergen Reaction Noted   Dust mite extract  12/13/2022    Family History  Problem Relation Age of Onset   Bipolar disorder Mother    Cancer Mother    Hypertension Paternal Grandmother    Diabetes Paternal Grandmother    Hyperlipidemia Paternal Grandmother    Heart disease Paternal Grandmother    Colon polyps Paternal Grandmother    Diabetes Other    Cancer Other    Colon cancer Neg Hx     Social History   Socioeconomic History   Marital status: Single    Spouse name: Not on file   Number of children: Not on file   Years of education: Not on file   Highest education level: Not on file  Occupational History   Not on file  Tobacco Use   Smoking status: Some Days    Types: E-cigarettes    Last attempt to quit: 05/2022    Years since quitting: 1.2   Smokeless tobacco: Never  Vaping Use   Vaping status: Some Days  Substance and Sexual Activity   Alcohol use: Yes    Comment: occ   Drug use: Not Currently    Types: Marijuana    Comment: tried marijuana a few times as a teenager; hit vape pen with THC in 2024 by accident   Sexual activity: Yes    Birth control/protection: I.U.D.  Other Topics Concern   Not on file  Social History Narrative   ** Merged History Encounter **       Lives with her grandmother. [facility] in spring 2021.    Social Drivers of Corporate investment banker  Strain: Low Risk  (04/28/2023)   Overall Financial Resource Strain (CARDIA)    Difficulty of Paying Living Expenses: Not very hard  Food Insecurity: No Food Insecurity (04/28/2023)   Hunger Vital Sign    Worried About Running Out of Food in the Last Year: Never true    Ran Out of Food in the Last Year: Never true  Transportation Needs: No Transportation Needs (04/28/2023)   PRAPARE - Administrator, Civil Service (Medical): No    Lack of Transportation (Non-Medical): No  Physical Activity: Insufficiently Active (04/28/2023)   Exercise Vital Sign    Days of Exercise per Week: 1 day    Minutes of Exercise per Session: 10 min  Stress: No Stress Concern Present (04/28/2023)   [employer]  of Occupational Health - Occupational Stress Questionnaire    Feeling of Stress : Not at all  Social Connections: Socially Isolated (04/28/2023)   Social Connection and Isolation Panel [NHANES]    Frequency of Communication with Friends and Family: Never    Frequency of Social Gatherings with Friends and Family: Never    Attends Religious Services: 1 to 4 times per year    Active Member of [employer] or Organizations: No    Attends Banker Meetings: Never    Marital Status: Never married  Intimate Partner Violence: Not At Risk (04/28/2023)   Humiliation, Afraid, Rape, and Kick questionnaire    Fear of Current or Ex-Partner: No    Emotionally Abused: No    Physically Abused: No    Sexually Abused: No     Review of Systems   Gen: Denies any fever, chills, fatigue, weight loss, lack of appetite.  CV: Denies chest pain, heart palpitations, peripheral edema, syncope.  Resp: Denies shortness of breath at rest or with exertion. Denies wheezing or cough.  GI: Denies dysphagia or odynophagia. Denies jaundice, hematemesis, fecal incontinence. GU : Denies urinary burning, urinary frequency, urinary hesitancy MS: Denies joint pain, muscle weakness, cramps, or limitation of movement.   Derm: Denies rash, itching, dry skin Psych: Denies depression, anxiety, memory loss, and confusion Heme: Denies bruising, bleeding, and enlarged lymph nodes.   Physical Exam   BP 102/71   Pulse (!) 102   Temp 98.6 F (37 C)   Ht 5\' 10"  (1.778 m)   Wt 190 lb 6.4 oz (86.4 kg)   BMI 27.32 kg/m  General:   Alert and oriented. Pleasant and cooperative. Well-nourished and well-developed.  Head:  Normocephalic and atraumatic. Eyes:  Without icterus Abdomen:  +BS, soft, non-tender and non-distended. No HSM noted. No guarding or rebound. No masses appreciated.  Rectal:  Deferred  Msk:  Symmetrical without gross deformities. Normal posture. Extremities:  Without edema. Neurologic:  Alert and  oriented x4;  grossly normal neurologically. Skin:  Intact without significant lesions or rashes. Psych:  Alert and cooperative. Normal mood and affect.   Assessment   Patty Gomez is a 23 y.o. female presenting today with a history of chronic abdominal pain, N/V, GERD, intermittent diarrhea, last seen a year ago.  She has noted marked improvement with group therapy and close psychiatric follow-up. Unfortunately, she no-showed several appointments with psych and was discharged from the practice. After the visit, I reached out to psychiatrist, who recommended patient contact the practice again as a new provider is there that could see patient.   We will continue with pantoprazole BID, dicyclomine only as needed to avoid constipation, and will resume amitriptyline, as she has noted improvement in chronic abdominal pain and global symptoms with this. We certainly have room to titrate up if needed.   Clinically, patient has improved significantly with ongoing therapy. She was applauded on this.    PLAN    Pantoprazole BID Amitriptyline 25 mg each evening, can increase if needed Dicyclomine only prn Patient was encouraged to reach back out to psychiatry, as they are willing to see her again with  a different provider 3 month follow-up   Gelene Mink, PhD, ANP-BC [facility]astroenter[facility]

## 2023-08-20 NOTE — Patient Instructions (Addendum)
 I have sent amitriptyline to take once each evening at bedtime.   I recommend only taking dicyclomine as needed for abdominal cramping, as this can cause constipation.  We will see you back in 3 months!  I will reach out to psychiatry as well.  I enjoyed seeing you again today! I value our relationship and want to provide genuine, compassionate, and quality care. You may receive a survey regarding your visit with me, and I welcome your feedback! Thanks so much for taking the time to complete this. I look forward to seeing you again.      Gelene Mink, PhD, ANP-BC [facility]astroenter[facility]

## 2023-08-21 ENCOUNTER — Other Ambulatory Visit: Payer: Self-pay | Admitting: Internal Medicine

## 2023-08-21 DIAGNOSIS — R112 Nausea with vomiting, unspecified: Secondary | ICD-10-CM

## 2023-08-21 MED ORDER — ONDANSETRON HCL 4 MG PO TABS: 4.0000 mg | ORAL_TABLET | Freq: Three times a day (TID) | ORAL | 0 refills | Status: DC | PRN

## 2023-08-21 NOTE — Telephone Encounter (Signed)
 Phoned and advised the pt of the recommendations of calling [name], NP and where she is located. Pt agreed to call and to make an appt to be seen

## 2023-08-25 ENCOUNTER — Ambulatory Visit (INDEPENDENT_AMBULATORY_CARE_PROVIDER_SITE_OTHER): Payer: Medicaid Other | Admitting: Clinical

## 2023-08-25 DIAGNOSIS — F4310 Post-traumatic stress disorder, unspecified: Secondary | ICD-10-CM | POA: Diagnosis not present

## 2023-08-25 DIAGNOSIS — F331 Major depressive disorder, recurrent, moderate: Secondary | ICD-10-CM

## 2023-08-25 DIAGNOSIS — F411 Generalized anxiety disorder: Secondary | ICD-10-CM

## 2023-08-25 DIAGNOSIS — F902 Attention-deficit hyperactivity disorder, combined type: Secondary | ICD-10-CM | POA: Diagnosis not present

## 2023-08-25 DIAGNOSIS — F410 Panic disorder [episodic paroxysmal anxiety] without agoraphobia: Secondary | ICD-10-CM | POA: Diagnosis not present

## 2023-08-25 NOTE — Telephone Encounter (Signed)
 Message sent to Tobi Bastos already

## 2023-08-25 NOTE — Progress Notes (Signed)
 Virtual Visit via Video Note   I connected with Patty Gomez on 08/25/23 at  2:00 PM EST by a video enabled telemedicine application and verified that I am speaking with the correct person using two identifiers.   Location: Patient: Home Provider: Office   I discussed the limitations of evaluation and management by telemedicine and the availability of in person appointments. The patient expressed understanding and agreed to proceed.   THERAPY PROGRESS NOTE   Session Time: 2:00 PM- 2:30 PM   Participation Level: Active   Behavioral Response: CasualAlert/Hyperverbal   Type of Therapy: Individual Therapy   Treatment Goals addressed: Coping   Interventions: CBT, DBT, Solution Focused, Strength-based and Supportive   Summary: Patty Gomez is a 23y.o. female who presents with Depression, Anxiety, PTSD, ADHD . The OPT therapist worked with the patient for her OPT treatment session. The OPT therapist utilized Motivational Interviewing to assist in creating therapeutic repore. The patient in the session was engaged and work in collaboration giving feedback about her triggers and symptoms over the past few weeks. The patient spoke about attending a group at [facility] for ongoing support.The patient noted that she has continued to search for a med management provider and may be able to get med management thought [facility]. The patient spoke about continuing to check in with her probation office on a weekly basis.The OPT therapist worked with the patient on her impulsivity and decision making , as well as accountability during the session examining her behavior vs her individual goals. The patient spoke about the support of others being helpful. The patient spoke about serving her first weekend of her sentence during the past weekend the first of 14 weekends she will have to serve of her sentence. The patient spoke about having difficulty with having to be incarcerated on weekends including having  other inmates fighting and overdosing while she is incarcerated for the weekend. The patient spoke about reporting the experience to her probation officer.The patient did note having family support and praying have been helping her get through and not having to serve her sentence concurrently but on weekends has been helpful in her mental preparation.   Suicidal/Homicidal: Nowithout intent/plan   Therapist Response:The OPT therapist worked with the patient for the patients scheduled session. The patient was engaged in her session and gave feedback in relation to triggers, symptoms, and behavior responses over the past few weeks. The patient has just served her first weekend in [place] , the first of 15 total ordered in her legal case.  The OPT therapist worked with the patient utilizing an in session Cognitive Behavioral Therapy exercise. The patient was responsive in the session and verbalized, " I just try to stay to myself and not show my emotions I am just worried I am going to get raped and there is drugs in there".  The OPT therapist worked with the patient overviewing basic health areas including sleep cycle, eating habits, hygiene, and physical exercise. The patient in this session verbalized no current S/I or H/I. The OPT therapist worked with the patient on planning and working on her goals.  The patient continues to work to get re-established with a psychiatrist. The patient spoke about her experience being in jail over the course of the weekend and this being 1st weekend of 14 weekends remaining she has to serve as part of her legal sentence. The patient spoke about the negative impact of being in jail on her mental health and worked with the  OPT therapist in conceptualizing her situation with the patient verbalizing her thought and understanding around this being essential in moving forward and getting herself out of the legal system.The OPT therapist will continue treatment work with the patient in  her next scheduled session.   Plan: Return again in 2/3 weeks.   Diagnosis:      Axis I:Depression GAD PTSD ADHD                           Axis II: No diagnosis       Collaboration of Care: No additional collaboration for this session.   Patient/Guardian was advised Release of Information must be obtained prior to any record release in order to collaborate their care with an outside provider. Patient/Guardian was advised if they have not already done so to contact the registration department to sign all necessary forms in order for [place] to release information regarding their care.    Consent: Patient/Guardian gives verbal consent for treatment and assignment of benefits for services provided during this visit. Patient/Guardian expressed understanding and agreed to proceed.    I discussed the assessment and treatment plan with the patient. The patient was provided an opportunity to ask questions and all were answered. The patient agreed with the plan and demonstrated an understanding of the instructions.   The patient was advised to call back or seek an in-person evaluation if the symptoms worsen or if the condition fails to improve as anticipated.   I provided 30 minutes of non-face-to-face time during this encounter.   Suzan Garibaldi, LCSW   08/25/2023

## 2023-08-25 NOTE — Telephone Encounter (Signed)
 Patient  has medicaid and referral has to come from PCP. Looks like they sent one 2/12

## 2023-08-26 ENCOUNTER — Encounter: Payer: Self-pay | Admitting: Internal Medicine

## 2023-08-27 ENCOUNTER — Other Ambulatory Visit: Payer: Self-pay | Admitting: Internal Medicine

## 2023-08-27 DIAGNOSIS — R1013 Epigastric pain: Secondary | ICD-10-CM

## 2023-08-27 DIAGNOSIS — R109 Unspecified abdominal pain: Secondary | ICD-10-CM

## 2023-09-07 DIAGNOSIS — R1013 Epigastric pain: Secondary | ICD-10-CM | POA: Insufficient documentation

## 2023-09-08 ENCOUNTER — Other Ambulatory Visit: Payer: Self-pay | Admitting: Internal Medicine

## 2023-09-08 DIAGNOSIS — R112 Nausea with vomiting, unspecified: Secondary | ICD-10-CM

## 2023-09-14 DIAGNOSIS — N760 Acute vaginitis: Secondary | ICD-10-CM | POA: Diagnosis not present

## 2023-09-14 DIAGNOSIS — N3001 Acute cystitis with hematuria: Secondary | ICD-10-CM | POA: Diagnosis not present

## 2023-09-15 ENCOUNTER — Encounter: Payer: Self-pay | Admitting: Obstetrics & Gynecology

## 2023-09-15 ENCOUNTER — Ambulatory Visit: Admitting: Obstetrics & Gynecology

## 2023-09-15 VITALS — BP 135/81 | HR 94

## 2023-09-15 DIAGNOSIS — R4689 Other symptoms and signs involving appearance and behavior: Secondary | ICD-10-CM

## 2023-09-15 NOTE — Progress Notes (Signed)
 Follow up appointment for results: Concern for prolpase  Chief Complaint  Patient presents with   ?Prolapse    Blood pressure 135/81, pulse 94.  Went to Urgent care yesterday for ?UTI, treated for BV Was looking this am and thought she saw a bulge Here for evaluation  Used a mirror and the area of concern is her perineum, she looked just  after a BM She is not concerned how it appears now No other areas of concern  MEDS ordered this encounter: No orders of the defined types were placed in this encounter.   Orders for this encounter: No orders of the defined types were placed in this encounter.   Impression + Management Plan   ICD-10-CM   1. Concern with appearance of her perineum: normal on exam  R46.89       Follow Up: Return if symptoms worsen or fail to improve.     All questions were answered.  Past Medical History:  Diagnosis Date   Acute bronchitis 01/20/2013   Anxiety state 08/22/2015   Depression    Encounter for IUD insertion 01/27/2018   Hair loss 03/11/2022   Iron deficiency 05/15/2022   Migraines    Pain and swelling of toe of left foot 02/11/2022   Preventative health care 02/11/2022   Screening examination for STD (sexually transmitted disease) 03/20/2021   Trichimoniasis    Vaginal burning 03/20/2021   Vaginal discharge 03/20/2021   Vaginal irritation 06/04/2021    Past Surgical History:  Procedure Laterality Date   BIOPSY  05/07/2019   Procedure: BIOPSY;  Surgeon: West Bali, MD;  [facility]: [facility];  Service: Endoscopy;;   BIOPSY  08/23/2022   Procedure: BIOPSY;  Surgeon: Lanelle Bal, DO;  [facility]: [facility];  Service: Endoscopy;;   COLONOSCOPY     ESOPHAGOGASTRODUODENOSCOPY N/A 05/07/2019   Procedure: ESOPHAGOGASTRODUODENOSCOPY (EGD);  Surgeon: West Bali, MD;  [facility]: [facility];  Service: Endoscopy;  Laterality: N/A;  3:00pm   ESOPHAGOGASTRODUODENOSCOPY (EGD) WITH PROPOFOL N/A 08/23/2022   Procedure:  ESOPHAGOGASTRODUODENOSCOPY (EGD) WITH PROPOFOL;  Surgeon: Lanelle Bal, DO;  [facility]: [facility];  Service: Endoscopy;  Laterality: N/A;  1:15 pm ,asa 2   TONSILLECTOMY      OB History     Gravida  0   Para  0   Term  0   Preterm  0   AB  0   Living  0      SAB  0   IAB  0   Ectopic  0   Multiple  0   Live Births  0           Allergies  Allergen Reactions   Dust Mite Extract     Social History   Socioeconomic History   Marital status: Single    Spouse name: Not on file   Number of children: Not on file   Years of education: Not on file   Highest education level: Not on file  Occupational History   Not on file  Tobacco Use   Smoking status: Some Days    Types: E-cigarettes    Last attempt to quit: 05/2022    Years since quitting: 1.3   Smokeless tobacco: Never  Vaping Use   Vaping status: Some Days  Substance and Sexual Activity   Alcohol use: Yes    Comment: occ   Drug use: Not Currently    Types: Marijuana    Comment: tried marijuana a few  times as a teenager; hit vape pen with THC in 2024 by accident   Sexual activity: Yes    Birth control/protection: I.U.D.  Other Topics Concern   Not on file  Social History Narrative   ** Merged History Encounter **       Lives with her grandmother. [facility] in spring 2021.    Social Drivers of Corporate investment banker Strain: Low Risk  (04/28/2023)   Overall Financial Resource Strain (CARDIA)    Difficulty of Paying Living Expenses: Not very hard  Food Insecurity: No Food Insecurity (04/28/2023)   Hunger Vital Sign    Worried About Running Out of Food in the Last Year: Never true    Ran Out of Food in the Last Year: Never true  Transportation Needs: No Transportation Needs (04/28/2023)   PRAPARE - Administrator, Civil Service (Medical): No    Lack of Transportation (Non-Medical): No  Physical Activity: Insufficiently Active (04/28/2023)   Exercise Vital Sign    Days of  Exercise per Week: 1 day    Minutes of Exercise per Session: 10 min  Stress: No Stress Concern Present (04/28/2023)   [employer] of Occupational Health - Occupational Stress Questionnaire    Feeling of Stress : Not at all  Social Connections: Socially Isolated (04/28/2023)   Social Connection and Isolation Panel [NHANES]    Frequency of Communication with Friends and Family: Never    Frequency of Social Gatherings with Friends and Family: Never    Attends Religious Services: 1 to 4 times per year    Active Member of [employer] or Organizations: No    Attends Engineer, structural: Never    Marital Status: Never married    Family History  Problem Relation Age of Onset   Bipolar disorder Mother    Cancer Mother    Hypertension Paternal Grandmother    Diabetes Paternal Grandmother    Hyperlipidemia Paternal Grandmother    Heart disease Paternal Grandmother    Colon polyps Paternal Grandmother    Diabetes Other    Cancer Other    Colon cancer Neg Hx

## 2023-09-17 ENCOUNTER — Other Ambulatory Visit: Payer: Self-pay | Admitting: Internal Medicine

## 2023-09-17 DIAGNOSIS — R112 Nausea with vomiting, unspecified: Secondary | ICD-10-CM

## 2023-09-23 ENCOUNTER — Telehealth (HOSPITAL_COMMUNITY): Payer: Self-pay | Admitting: *Deleted

## 2023-09-23 NOTE — Telephone Encounter (Signed)
 Lmom to confirm appt for Thursday April 10th.

## 2023-09-23 NOTE — Telephone Encounter (Signed)
 Confirmed appt.

## 2023-09-24 ENCOUNTER — Ambulatory Visit (INDEPENDENT_AMBULATORY_CARE_PROVIDER_SITE_OTHER): Admitting: Clinical

## 2023-09-24 DIAGNOSIS — F411 Generalized anxiety disorder: Secondary | ICD-10-CM | POA: Diagnosis not present

## 2023-09-24 DIAGNOSIS — F902 Attention-deficit hyperactivity disorder, combined type: Secondary | ICD-10-CM

## 2023-09-24 DIAGNOSIS — F410 Panic disorder [episodic paroxysmal anxiety] without agoraphobia: Secondary | ICD-10-CM

## 2023-09-24 DIAGNOSIS — F909 Attention-deficit hyperactivity disorder, unspecified type: Secondary | ICD-10-CM

## 2023-09-24 DIAGNOSIS — F32A Depression, unspecified: Secondary | ICD-10-CM | POA: Diagnosis not present

## 2023-09-24 DIAGNOSIS — F4310 Post-traumatic stress disorder, unspecified: Secondary | ICD-10-CM

## 2023-09-24 NOTE — Progress Notes (Signed)
 Virtual Visit via Telephone Note  I connected with Patty Gomez on 09/24/23 at  1:00 PM EDT by telephone and verified that I am speaking with the correct person using two identifiers.  Location: Patient: home Provider: office   I discussed the limitations, risks, security and privacy concerns of performing an evaluation and management service by telephone and the availability of in person appointments. I also discussed with the patient that there may be a patient responsible charge related to this service. The patient expressed understanding and agreed to proceed.  THERAPY PROGRESS NOTE   Session Time: 1:00 PM- 1:45 PM   Participation Level: Active   Behavioral Response: CasualAlert/Hyperverbal   Type of Therapy: Individual Therapy   Treatment Goals addressed: Coping   Interventions: CBT, DBT, Solution Focused, Strength-based and Supportive   Summary: Patty Gomez is a 23y.o. female who presents with Depression, Anxiety, PTSD, ADHD . The OPT therapist worked with the patient for her OPT treatment session. The OPT therapist utilized Motivational Interviewing to assist in creating therapeutic repore. The patient in the session was engaged and work in collaboration giving feedback about her triggers and symptoms over the past few weeks. The patient noted she was let go from [facility] because she is not have addiction problems and her insurance would not cover them. The patient spoke about getting med management with [name] with the first appointment for Med Therapy. The patient spoke about continuing to check in with her probation office on a weekly basis the patient has a new assigned PO who is also a Wellsite geologist.The OPT therapist worked with the patient on her impulsivity and decision making , as well as accountability during the session examining her behavior vs her individual goals. The patient spoke about the support of others being helpful. The patient spoke about  serving  weekends of her sentence during the past few weekends.The patient did note having family support and praying have been helping her get through  and not having to serve her sentence concurrently but on weekends has been helpful in her mental preparation.   Suicidal/Homicidal: Nowithout intent/plan   Therapist Response:The OPT therapist worked with the patient for the patients scheduled session. The patient was engaged in her session and gave feedback in relation to triggers, symptoms, and behavior responses over the past few weeks. The patient spoke about her ongoing experience in [place] during the weekends.  The OPT therapist worked with the patient utilizing an in session Cognitive Behavioral Therapy exercise. The patient was responsive in the session and verbalized, " I just try to not say a lot and look tough and just try to make it through the weekends, but I have been able to talk with some people and tell my story and they tell me their story and we cry, but I know this is what I have to do and I know it could be worse and I am glad I do not have to serve like 79yrs in [place]".  . Additionally the patient verbalized . "  I know I am were I am to learn and grow and improve I am working on managing my emotions and I know I am in control and if I just get through this I will eventually see a brighter side because that is what god has put me here to do". The OPT therapist worked with the patient overviewing basic health areas including sleep cycle, eating habits, hygiene, and physical exercise. The patient in this session verbalized no  current S/I or H/I. The OPT therapist worked with the patient on planning and working on her goals.  The patient noted she has gotten re-established with a psychiatrist and is schedule with [name] tomorrow for a med therapy appointment.. The patient spoke about her experience being in jail over the course of the past few weekends .The patient noted that she has 10  weekends left to serve in [place].The patient spoke about the negative impact of being in jail on her mental health and worked with the OPT therapist in conceptualizing her situation with the patient verbalizing her thought and understanding around this being essential in moving forward and getting herself out of the legal system and legal involvement.The patient spoke about her own introspective in how she has continued to work through having a family member murdered and losing a loved one.The OPT therapist will continue treatment work with the patient in her next scheduled session.   Plan: Return again in 2/3 weeks.   Diagnosis:      Axis I:Depression GAD PTSD ADHD                           Axis II: No diagnosis       Collaboration of Care: No additional collaboration for this session.   Patient/Guardian was advised Release of Information must be obtained prior to any record release in order to collaborate their care with an outside provider. Patient/Guardian was advised if they have not already done so to contact the registration department to sign all necessary forms in order for [place] to release information regarding their care.    Consent: Patient/Guardian gives verbal consent for treatment and assignment of benefits for services provided during this visit. Patient/Guardian expressed understanding and agreed to proceed.    I discussed the assessment and treatment plan with the patient. The patient was provided an opportunity to ask questions and all were answered. The patient agreed with the plan and demonstrated an understanding of the instructions.   The patient was advised to call back or seek an in-person evaluation if the symptoms worsen or if the condition fails to improve as anticipated.   I provided 45 minutes of non-face-to-face time during this encounter.   Suzan Garibaldi, LCSW   09/24/2023

## 2023-09-25 ENCOUNTER — Ambulatory Visit (INDEPENDENT_AMBULATORY_CARE_PROVIDER_SITE_OTHER): Admitting: Registered Nurse

## 2023-09-25 ENCOUNTER — Encounter (HOSPITAL_COMMUNITY): Payer: Self-pay | Admitting: Registered Nurse

## 2023-09-25 DIAGNOSIS — F50819 Binge eating disorder, unspecified: Secondary | ICD-10-CM

## 2023-09-25 DIAGNOSIS — F902 Attention-deficit hyperactivity disorder, combined type: Secondary | ICD-10-CM

## 2023-09-25 DIAGNOSIS — G4700 Insomnia, unspecified: Secondary | ICD-10-CM | POA: Diagnosis not present

## 2023-09-25 DIAGNOSIS — F410 Panic disorder [episodic paroxysmal anxiety] without agoraphobia: Secondary | ICD-10-CM

## 2023-09-25 DIAGNOSIS — F4310 Post-traumatic stress disorder, unspecified: Secondary | ICD-10-CM | POA: Diagnosis not present

## 2023-09-25 DIAGNOSIS — F411 Generalized anxiety disorder: Secondary | ICD-10-CM | POA: Diagnosis not present

## 2023-09-25 DIAGNOSIS — F330 Major depressive disorder, recurrent, mild: Secondary | ICD-10-CM

## 2023-09-25 MED ORDER — LISDEXAMFETAMINE DIMESYLATE 30 MG PO CAPS: 30.0000 mg | ORAL_CAPSULE | Freq: Every day | ORAL | 0 refills | Status: DC

## 2023-09-25 MED ORDER — FLUOXETINE HCL 10 MG PO CAPS: 10.0000 mg | ORAL_CAPSULE | Freq: Every day | ORAL | 0 refills | Status: DC

## 2023-09-25 MED ORDER — HYDROXYZINE HCL 10 MG PO TABS: ORAL_TABLET | ORAL | 0 refills | Status: DC

## 2023-09-25 NOTE — Progress Notes (Signed)
 Psychiatric Initial Adult Assessment   Patient Identification: Patty Gomez MRN:  [id number] Date of Evaluation:  09/25/2023  Virtual Visit via Video Note  I connected with Patty Gomez on 09/25/23 at 10:00 AM EDT by a video enabled telemedicine application and verified that I am speaking with the correct person using two identifiers.  Location: Patient: Home Provider: Home office   I discussed the limitations of evaluation and management by telemedicine and the availability of in person appointments. The patient expressed understanding and agreed to proceed.   I discussed the assessment and treatment plan with the patient. The patient was provided an opportunity to ask questions and all were answered. The patient agreed with the plan and demonstrated an understanding of the instructions.   The patient was advised to call back or seek an in-person evaluation if the symptoms worsen or if the condition fails to improve as anticipated.  I provided 60 minutes of non-face-to-face time during this encounter.   Assunta Found, NP   Referral Source: Billie Lade, MD [facility] Chief Complaint:   Chief Complaint  Patient presents with   Establish Care    Medication management   Visit Diagnosis: No diagnosis found.  History of Present Illness:  Patty Gomez 23 y.o. female presents today to reestablish care for medication management.  She is seen via virtual video visit by this provider, and chart reviewed on 09/25/23.  Her psychiatric history is significant for PTSD, ADHD, major depressive disorder, generalized anxiety disorder with panic attacks, and binge eating.  She is not prescribed any medications at this time for the purpose of treating her mental health but is prescribed Amitriptyline by her GI doctor for "stomach issues.  Reporting that is has been somewhat helpful but feel that it has made GI issues worse.  Reports that her anxiety issues are worse than  depression. Reports she is having to do jail time on the weekends related to a DUI charge and because she was unable to get into a rehab program.  "I know it's my fault and God wouldn't put me anywhere I couldn't deal with and I know things could be worse.  But I feel like I'm going crazy when I'm in there.  I up for 2 days when there cause I can't sleep.  I feel like I'm not going to make it.  It's not like wanting to kill myself or anything like that, I just feel crazy and have to make myself calm down and try to use breathing techniques."   Reports another stressor is the death of her brother and father.  Reports her brother was killed 6 months ago.  "It was really bad when it first happened because the DA was telling me that the guy was out.  I also know I can't live in fear because that's the devil, and had to listen to music to calm down.  But it was shocking that he was out.  I told the DA I was scared.  But I'm glad that I know God.  My dad and brother didn't believe in God and I'm growing closer.  I have forgiven the people in reality but I know that I will be alone.  I feel like I've lost my family.  I'm the last one alive.  But I know God has a plan for me.  After it happened I didn't even leave the house because I was afraid.  Everyone thought I should be over it and just move  on but I couldn't.  I don't hang around bad people.   I go to church and play youth basketball and that is about it other than helping my grandparents." Patient states she was raised by her paternal grandparents "and my father."  States she doesn't have that great of a relationship with her mother.  "I didn't even go to my brothers funeral because my mother was saying negative things about me cause she thought it was my fault.  She was telling people that my brother.   didn't even love me.  She continued to tell about the relationship between her mother and brother.  Reports she also has half siblings but not close.  States her  sister has reached out to her "But my grandma told me to be careful because it she doesn't know if it is a set up."  No further elaboration on why mother/sister would be trying to set her up.      She reports she has a history of binge eating which has gotten worse since all she does is "sit in the house, watch TV, eat, and I wasn't really going anywhere.  "I am getting better.  I'm going to church and playing basketball now."    She denies prior suicide attempt, psychiatric hospitalization, suicide attempt, and self injurious behavior.  She reports she has drank no alcohol since 2023.  Denies illicit drug use.   Reports she has been on several psychotropic medications but really wasn't compliant so unable to say if they really worked or not (Wellbutrin, Prozac, Zoloft, Lexapro, Amitriptyline, Concerta, and Vyvanse).  "I was really against being medicated, and then everyone was telling me it was just in head and to get over it.  I thought I could deal with it on my own."  She does report adverse reaction to Wellbutrin "It made me feel funny."    Patty Gomez is currently living with her grandparent, and unemployed.  Reports she has 11 more weekends of jail time to do.  Today she endorses symptoms of anxiety, depression disturbed sleep, decreased concentration/focus, low energy, over eating, isolation, crying spells, easily annoyed/irritated.  Reports she is ready to get her life together and get on medications because she have come to terms that she needs medications to get better.  "I don't like taking them but I want to get better, I want to live and have a life without looking over my shoulder, always irritable.  I want to be free."  Currently she denies suicidal/self-harm/homicidal ideation, and psychosis.  Reports continues to have some paranoia related to the incidents with her father and brother both being murdered.     PHQ 2/9, C-SSRS, GAD 7, AUDIT, and AIMS screening conducted during today's  visit, see scores below.    Recommended the following:   Start Prozac 10 mg daily (depression, anxiety, binge eating, and PTSD).  Plan to start taper of Amitriptyline at next visit.  Reports she also has an appointment with her GI and will inform about the coming taper.   Start Hydroxyzine 10-20 mg Tid prn (anxiety, sleep). Start Vyvanse 30 mg daily (ADHD).  Continue psychotherapy. Will give resources for trauma therapy Education on the side effect/efficacy of Prozac, Hydroxyzine, and Vyvanse.  She is informed that usually takes a couple of weeks before notable improvements are seen.  She voices understanding with information being given to her today and is agreeable to recommendations.     Associated Signs/Symptoms: Depression Symptoms:  depressed mood, anhedonia,  insomnia, difficulty concentrating, anxiety, panic attacks, disturbed sleep, weight gain, increased appetite, (Hypo) Manic Symptoms:  Distractibility, Irritable Mood, Labiality of Mood, Anxiety Symptoms:  Excessive Worry, Panic Symptoms, Social Anxiety, Psychotic Symptoms:  Paranoia, PTSD Symptoms: Had a traumatic exposure:  The death of her father and brother  Past Psychiatric History: PTSD, ADHD, major depressive disorder, generalized anxiety disorder with panic attacks, and binge eating  Previous Psychotropic Medications: Yes  Wellbutrin, Prozac, Zoloft, Lexapro, Amitriptyline, Concerta, and Vyvanse  Substance Abuse History in the last 12 months:  No.  Consequences of Substance Abuse: Weekend jail time  Past Medical History:  Past Medical History:  Diagnosis Date   Acute bronchitis 01/20/2013   Anxiety state 08/22/2015   Depression    Encounter for IUD insertion 01/27/2018   Hair loss 03/11/2022   Iron deficiency 05/15/2022   Migraines    Pain and swelling of toe of left foot 02/11/2022   Preventative health care 02/11/2022   Screening examination for STD (sexually transmitted disease) 03/20/2021    Trichimoniasis    Vaginal burning 03/20/2021   Vaginal discharge 03/20/2021   Vaginal irritation 06/04/2021    Past Surgical History:  Procedure Laterality Date   BIOPSY  05/07/2019   Procedure: BIOPSY;  Surgeon: West Bali, MD;  [facility];  Service: Endoscopy;;   BIOPSY  08/23/2022   Procedure: BIOPSY;  Surgeon: Lanelle Bal, DO;  [facility];  Service: Endoscopy;;   COLONOSCOPY     ESOPHAGOGASTRODUODENOSCOPY N/A 05/07/2019   Procedure: ESOPHAGOGASTRODUODENOSCOPY (EGD);  Surgeon: West Bali, MD;  [facility];  Service: Endoscopy;  Laterality: N/A;  3:00pm   ESOPHAGOGASTRODUODENOSCOPY (EGD) WITH PROPOFOL N/A 08/23/2022   Procedure: ESOPHAGOGASTRODUODENOSCOPY (EGD) WITH PROPOFOL;  Surgeon: Lanelle Bal, DO;  [facility];  Service: Endoscopy;  Laterality: N/A;  1:15 pm ,asa 2   TONSILLECTOMY     Family Psychiatric History: See below in family history  Family History:  Family History  Problem Relation Age of Onset   Bipolar disorder Mother    Cancer Mother    Hypertension Paternal Grandmother    Diabetes Paternal Grandmother    Hyperlipidemia Paternal Grandmother    Heart disease Paternal Grandmother    Colon polyps Paternal Grandmother    Diabetes Other    Cancer Other    Colon cancer Neg Hx     Social History:   Social History   Socioeconomic History   Marital status: Single    Spouse name: Not on file   Number of children: Not on file   Years of education: Not on file   Highest education level: Not on file  Occupational History   Not on file  Tobacco Use   Smoking status: Some Days    Types: E-cigarettes    Last attempt to quit: 05/2022    Years since quitting: 1.3   Smokeless tobacco: Never  Vaping Use   Vaping status: Some Days  Substance and Sexual Activity   Alcohol use: Yes    Comment: occ   Drug use: Not Currently    Types: Marijuana    Comment: tried marijuana a few times as a teenager;  hit vape pen with THC in 2024 by accident   Sexual activity: Yes    Birth control/protection: I.U.D.  Other Topics Concern   Not on file  Social History Narrative   ** Merged History Encounter **       Lives with her grandmother. [facility] in spring  2021.    Social Drivers of Health   Financial Resource Strain: Low Risk  (04/28/2023)   Overall Financial Resource Strain (CARDIA)    Difficulty of Paying Living Expenses: Not very hard  Food Insecurity: No Food Insecurity (04/28/2023)   Hunger Vital Sign    Worried About Running Out of Food in the Last Year: Never true    Ran Out of Food in the Last Year: Never true  Transportation Needs: No Transportation Needs (04/28/2023)   PRAPARE - Administrator, Civil Service (Medical): No    Lack of Transportation (Non-Medical): No  Physical Activity: Insufficiently Active (04/28/2023)   Exercise Vital Sign    Days of Exercise per Week: 1 day    Minutes of Exercise per Session: 10 min  Stress: No Stress Concern Present (04/28/2023)   [employer] of Occupational Health - Occupational Stress Questionnaire    Feeling of Stress : Not at all  Social Connections: Socially Isolated (04/28/2023)   Social Connection and Isolation Panel [NHANES]    Frequency of Communication with Friends and Family: Never    Frequency of Social Gatherings with Friends and Family: Never    Attends Religious Services: 1 to 4 times per year    Active Member of [employer] or Organizations: No    Attends Banker Meetings: Never    Marital Status: Never married     Allergies:   Allergies  Allergen Reactions   Dust Mite Extract     Metabolic Disorder Labs:  No recent labs.  Labs ordered Lab Results  Component Value Date   HGBA1C 5.2 10/17/2022   No results found for: "PROLACTIN" Lab Results  Component Value Date   CHOL 162 10/17/2022   TRIG 69 10/17/2022   HDL 91 10/17/2022   CHOLHDL 1.8 10/17/2022   LDLCALC 58 10/17/2022   Lab  Results  Component Value Date   TSH 0.953 10/17/2022     Current Medications: Current Outpatient Medications  Medication Sig Dispense Refill   amitriptyline (ELAVIL) 25 MG tablet Take 1 tablet (25 mg total) by mouth at bedtime. 30 tablet 3   dicyclomine (BENTYL) 10 MG capsule TAKE ONE CAPSULE BY MOUTH FOUR TIMES DAILY BEFORE MEALS AND AT BEDTIME 30 capsule 1   levonorgestrel (LILETTA) 19.5 MCG/DAY IUD IUD 1 each by Intrauterine route once.     ondansetron (ZOFRAN) 4 MG tablet TAKE ONE TABLET BY MOUTH EVERY 8 HOURS AS NEEDED FOR NAUSEA AND VOMITING 20 tablet 0   pantoprazole (PROTONIX) 40 MG tablet Take 1 tablet (40 mg total) by mouth 2 (two) times daily. 60 tablet 2   No current facility-administered medications for this visit.    Musculoskeletal: Strength & Muscle Tone:  Unable to assess via virtual visit Gait & Station:  Unable to assess via virtual visit Patient leans: N/A  Psychiatric Specialty Exam: Review of Systems  Constitutional:        No other complaints voiced  Psychiatric/Behavioral:  Positive for decreased concentration, dysphoric mood and sleep disturbance. Negative for hallucinations, self-injury and suicidal ideas. Agitation: Irritability.The patient is nervous/anxious and is hyperactive.   All other systems reviewed and are negative.   There were no vitals taken for this visit.There is no height or weight on file to calculate BMI.  General Appearance: Casual  Eye Contact:  Good  Speech:  Clear and Coherent and Normal Rate  Volume:  Normal  Mood:  Anxious and Depressed  Affect:  Congruent, Depressed, and Tearful  Thought Process:  Coherent, Goal Directed, and Descriptions of Associations: Circumstantial  Orientation:  Full (Time, Place, and Person)  Thought Content:  Logical, Paranoid Ideation, and Rumination  Suicidal Thoughts:  No  Homicidal Thoughts:  No  Memory:  Immediate;   Good Recent;   Good Remote;   Good  Judgement:  Intact  Insight:  Present   Psychomotor Activity:  Normal  Concentration:  Concentration: Fair and Attention Span: Fair  Recall:  Good  Fund of Knowledge:Good  Language: Good  Akathisia:  No  Handed:  Right  AIMS (if indicated):  done  Assets:  Communication Skills Desire for Improvement Housing Leisure Time Physical Health Resilience Social Support  ADL's:  Intact  Cognition: WNL  Sleep:  Fair   Screenings: GAD-7    Flowsheet Row Office Visit from 04/28/2023 in [facility] Office Visit from 01/17/2023 in [facility] Office Visit from 10/17/2022 in [facility] Office Visit from 07/19/2022 in [facility] Video Visit from 06/13/2022 in [facility]  Total GAD-7 Score 0 15 16 0 0      PHQ2-9    Flowsheet Row Office Visit from 08/01/2023 in [facility] Office Visit from 04/28/2023 in [facility] at [facility] Office Visit from 01/17/2023 in [facility] Office Visit from 10/17/2022 in [facility] Office Visit from 07/19/2022 in [facility]  PHQ-2 Total Score 0 0 0 5 0  PHQ-9 Total Score -- 0 0 18 0      Flowsheet Row Admission (Discharged) from 08/23/2022 in [facility] from 03/22/2022 in [facility] [place] Video Visit from 03/11/2022 in [facility] at [place]  C-SSRS RISK CATEGORY No Risk No Risk No Risk       Assessment and Plan: Assessment: Patient seen and examined as noted above. Summary: Today Patty Gomez appears to be doing anxious, depressed, and tearful on/off during assessment.  Reports worsening depression, anxiety, and mood since the death of her brother 6 months ago.  Reports no psychotropic medications for "about a year."  Reports when treated before for  mental health she wasn't compliant with medications but feel she ready for treatment now.     She denies suicidal/self-harm/homicidal ideation,  and psychosis.  She is endorsing some paranoia related to the murder of her father and then her brother.  She also endorse mood swings.      During visit she is dressed appropriate for age and weather.  Patty Gomez is seated comfortably in view of camera.  She is tearful on/off throughout assessment but there is no noted distress.  She is alert/oriented x 4, calm/cooperative and mood is anxious and depressed with congruent affect.  She spoke in a clear tone at moderate volume, and normal pace, with good eye contact.  Rumination of thought process but she is coherent, relevant, and there is no indication that she is currently responding to internal/external stimuli or experiencing delusional thought content.    1. Generalized anxiety disorder with panic attacks (Primary) Worsening anxiety, depression, mood, and sleep over the last 6 months.   - FLUoxetine (PROZAC) 10 MG capsule; Take 1 capsule (10 mg total) by mouth daily.  Dispense: 30 capsule; Refill: 0 - hydrOXYzine (ATARAX) 10 MG tablet; Take 10 - 20 mg (1 to 2 tablets) three times  a day as needed for anxiety/sleep  Dispense: 30 tablet; Refill: 0  2. Major depressive disorder, recurrent, mild (HCC) Worsening anxiety, depression, mood, and sleep over the last 6 months. - FLUoxetine (PROZAC) 10 MG capsule; Take 1 capsule (10 mg total) by mouth daily.  Dispense: 30 capsule; Refill: 0  3. Binge eating disorder, unspecified severity Reporting a history of binge eating that has worsened over the last 6 months - FLUoxetine (PROZAC) 10 MG capsule; Take 1 capsule (10 mg total) by mouth daily.  Dispense: 30 capsule; Refill: 0 - lisdexamfetamine (VYVANSE) 30 MG capsule; Take 1 capsule (30 mg total) by mouth daily.  Dispense: 30 capsule; Refill: 0  4. PTSD (post-traumatic stress disorder) Father was murdered and most  recent brother murdered 6 months ago - FLUoxetine (PROZAC) 10 MG capsule; Take 1 capsule (10 mg total) by mouth daily.  Dispense: 30 capsule; Refill: 0  5. Attention deficit hyperactivity disorder (ADHD), combined type Difficulty focusing/concentration, finishing projects at home - lisdexamfetamine (VYVANSE) 30 MG capsule; Take 1 capsule (30 mg total) by mouth daily.  Dispense: 30 capsule; Refill: 0  6. Insomnia, unspecified type Worsening anxiety, depression, mood, and sleep over the last 6 months. - hydrOXYzine (ATARAX) 10 MG tablet; Take 10 - 20 mg (1 to 2 tablets) three times a day as needed for anxiety/sleep  Dispense: 30 tablet; Refill: 0  Plan: Medications: Meds ordered this encounter  Medications   FLUoxetine (PROZAC) 10 MG capsule    Sig: Take 1 capsule (10 mg total) by mouth daily.    Dispense:  30 capsule    Refill:  0    Supervising Provider:   Lolly Mustache, SYED T [[id number]]   hydrOXYzine (ATARAX) 10 MG tablet    Sig: Take 10 - 20 mg (1 to 2 tablets) three times a day as needed for anxiety/sleep    Dispense:  30 tablet    Refill:  0    Supervising Provider:   Kathryne Sharper T [[id number]]   lisdexamfetamine (VYVANSE) 30 MG capsule    Sig: Take 1 capsule (30 mg total) by mouth daily.    Dispense:  30 capsule    Refill:  0    Supervising Provider:   Kathryne Sharper T [[id number]]    Lab Orders         Lipid panel         TSH         HgB A1c         Comprehensive metabolic panel with GFR         CBC with Differential         Prolactin         Magnesium         Pregnancy, urine         Drugs of abuse screen w/o alc (for BH OP)     Other:  Continue counseling/therapy.  Resources given for trauma therapy   Follow up with PCP to obtain EKG Patty Gomez is instructed to call 911, [phone], mobile crisis, or present to the nearest emergency room should she experience any suicidal/homicidal ideation, auditory/visual/hallucinations, or detrimental worsening of her mental health condition.   Patty Gomez has participated in the development of this treatment plan and verbalized her agreement with plan as listed.  Follow Up: Return in 2 weeks for medication management Call in the interim for any side-effects, decompensation, questions, or problems  Collaboration of Care: Medication Management AEB Restarted psychotropic medications, Primary Care Provider AEB referral  to PCP for EKG, Referral or follow-up with counselor/therapist AEB Resources for trauma therapy, and Other Labs ordered  Patient/Guardian was advised Release of Information must be obtained prior to any record release in order to collaborate their care with an outside provider. Patient/Guardian was advised if they have not already done so to contact the registration department to sign all necessary forms in order for [place] to release information regarding their care.   Consent: Patient/Guardian gives verbal consent for treatment and assignment of benefits for services provided during this visit. Patient/Guardian expressed understanding and agreed to proceed.   Patty Kuras, NP 4/10/202510:19 AM

## 2023-09-25 NOTE — Patient Instructions (Addendum)
 Labs have been ordered since it has been a while since you had any.  Labs are checked at least yearly and more frequently depending on prescribed medication.  Routine blood work is an important part of assessing a patient's overall health and to rule out any condition that is not psychiatric related.  Tests such as a complete blood count, metabolic panel, lipid panel, thyroid function tests, and hemoglobin A1c test (screening for diabetes) can help a psychiatrist understand the general medical health of a patient.  Blood work can help make informed decisions about a potential differential diagnosis and help understand other factors that may be the correct reason for the presentation.  In some cases, blood work can aid in the diagnosis, medication management, and/or treatment of your a mental health. Please have labs drawn prior to next scheduled visit.   Routine Labs checked at least yearly.  May need to be checked more often depending on prescribed medications.   CBC with Differential/Platelet    Comprehensive metabolic panel    Hemoglobin A1c    Magnesium    Ethanol    Urinalysis, Routine w reflex microscopic Urine, Clean Catch    Pregnancy, urine (females of child bearing age) POCT Urine Drug Screen:  If prescribed any controlled substance  Also recommend EKG prior to starting psychotropic medications as a baseline and periodically after starting psychotropic medications to monitor for prolong QTc which can indicate the presence of cardiac risk factors.  Studies have shown that some psychotropic medications can increase the risk of prolong QTc.  Please have your primary care provider to do EKG at your next scheduled visit and send results if not available on Epic.       Call 911, 988, mobile crisis, or present to the nearest emergency room should you experience any suicidal/homicidal ideation, auditory/visual/hallucinations, or detrimental worsening of your mental health.  Mobile Crisis Response  Teams Listed by counties in vicinity of [facility] providers [facility] [phone] [facility] [phone] [facility] [phone] [facility] [phone] [place]                * [facility] [phone]                * [facility] [phone]  [facility] [phone] [facility] [employer]  [phone] * [facility] [phone]     Facilities that offer trauma therapy Online search at [email or web address] Enter the type of therapist looking for and the area you are in and should pull up therapist in your area.     Below are a list of outpatient psychiatric services that accept Medicaid:   Offers Virtual Therapy  [address] [place], [place] [address]  Phone:  [phone] Fax: [phone]  [facility] Out[facility] Program (OP) An Outpatient Treatment Program, or OP, is a service for individuals seeking support for substance abuse or mental health concerns who do not require frequent or intense support or safety monitoring. This level is appropriate for people with less severe disorders, or as a step-down from more intensive services. At [facility], OP is available for individuals aged 25 and above. It consists of basic treatment services offered in individual and group format and totals to less than 9 hours a week. Both virtual and in-person options for attending are available. OP is conducted by treatment professionals licensed to serve individuals with mental health and  substance use disorders within [place]. OP helps individuals address a broad range of psychological and interpersonal challenges including: Substance Related Disorders Behavioral Addictions Anxiety Depression Trauma and Stressor  Related Issues PTSD Parenting and Family Issues Relationship Issues Stress Self-Esteem Bipolar Disorders Life Transitions Personality Disorders ADHD Grief and Loss *[facility] is proud to offer specialized treatment services on an outpatient level including, EMDR, Marriage/Family Counseling and [facility] Counsel[facility]  We currently accept all major insurance, including: Medicaid Uninsured [employer] Options [facility] Options Med[employer]   [facility] Phone:  [phone] Website:  [email or web address]       How to get started with virtual therapy covered by Medicaid: Medicaid-covered members can call our Admissions Team 24/7 or fill out our online form to learn about their plan's specific benefits and get started with treatment. Once we verify your Medicaid benefits, our Clinical Team will conduct a thorough mental health assessment to create your personalized treatment plan. Medicaid members can get started with their personalized treatment plan (which includes curated groups, individual therapy, and family therapy) in as little as 24 hours. Call [phone]  What we Treat:  Anxiety Treatment for Teens and Adults  Our therapists specialize in cognitive behavioral therapy, a leading anxiety treatment, to provide evidence-based mental healthcare for teens and adults dealing with anxiety disorders. Fill out the short form below or call us directly to start healing from anxiety today with [facility].  Depression Treatment for Teens and Adults Depression affects millions of people worldwide, but healing is possible with evidence-based treatment.   Trauma Treatment for Teens and Adults After surviving trauma, building connections and receiving trauma-informed care are critical for long-lasting healing. That's why [facility] offers trauma-informed therapy  in individual and group sessions.   Self-Harm Treatment for Teens and Adults Self-harm is often linked to serious mental health issues, which is why understanding the root of self-harm is key to long-lasting recovery.   Suicidal ideation Passive suicidal ideation, chronic suicidal ideation, previous suicide attempt  Substance Use Disorders Treatment for Teens and Adults:   Alcohol, marijuana, prescription drugs, opioids, amphetamines, cocaine, inhalants, hallucinogens, nicotine Understanding the mental health roots of substance use disorders (SUD) is key to long-lasting recovery.     Address:  [address] [place], [place] [address] Outpatient Hours: Mon-Fri 8AM to 5PM Phone: [phone] Fax: [phone] Offers: Behavioral [facility] Ur[facility]) Hours: 24/7 Phone: [phone] Fax: [phone] --- Address:  [address] [place], [place] [address] Hours: Mon-Fri 8AM-5PM Phone: [phone] Fax: [phone]  Services:  Adult Services Advanced Access Walk-In Assessments - All Disabilities If you're a walk-in patient there is generally a wait time involved on a "first come, first served basis" otherwise contact us beforehand to setup an appointment. If you're in crisis please use our 24-Hour Crisis Hotline for immediate access to a clinician. If this is your first time, please bring the following with you:   Insurance/Medicaid Card  ID or Social Security Card Any referral documentation  Routine Outpatient Therapy Psychiatry/Med Management Substance Abuse Intensive Outpatient (SAIOP) Medication Assisted Treatment - MAT (Suboxone) Tailored Care Management (TCM)  Youth Services Advanced Access Walk-In Assessments - All Disabilities Routine Outpatient Therapy Psychiatry/Med Management Tailored Care Management (TCM)    [email or web address] Phone: [phone] Fax: [phone] We take pride in offering evidence-based psychotherapy in [place],  [place], to help you and your loved ones achieve family cohesion, recover from addiction,  heal past trauma, overcome fears, parent more effectively, save your marriage, and much more. Beyond treating your symptoms, we focus on identifying the factors that give rise to behavioral and emotional distress. We equip you with tools necessary to manage and in many cases eliminate their root causes.   Our services target all age groups, including adults, adolescents, children, and families. Services are offered in the convenience of your home, school, in one of our office locations, or online. For after hours emergencies, our 24 Hour Crisis Response Line provides access to a live person who will help connect you with immediate support.  Psychiatric evaluation, medication consultation, and Treatment that targets a broad range of psychological concerns, including: ?Depression  ?Marital Conflict  ?Stress  Anxiety  ?Self Esteem  ?Women's Issues  ?Addiction & Substance Abuse  ?Grief & Loss  ?Identity concerns  ?Anger Management  ?Personality Disorder  ?Men's Issues  ?Trauma and PTSD  ?Child Conduct Problems  ?Psychosis  ?ADHD  ?Relationship Issues  ?Parenting  ?Bipolar Disorder  ?Domestic Violence  ?Employee Assistance Program  ?Spirituality  ?School Behavioral Problems  ?Codependency Depending upon identified need, licensed therapists utilize one or more of the following primary treatment modalities: Individual therapy - clients work one-on-one with a licensed therapist Family Therapy - Families work with a licensed therapist to addresses specific issues such as crisis, conflicts, or communication barriers that affect the psychological health of the family. Group Therapy - A group of patients work through problems by interacting with a therapist and a group of individuals with similar struggles. Marriage/Couples Therapy - Spouses, couples, or partners in intimate relationships work with a therapist to achieve  improved intimacy, understanding, conflict resolution, reconciliation or amicable separation.   What is MST-PSB? Multi - systemic Therapy for Problem Sexual Behavior (MST-PSB) is an adaptation of MST therapy developed for 44- to 42.46-year-old youth with sexually delinquent behaviors, including aggressive (sexual assault, rape) and non-aggressive (molestation of younger children) offenses. Youth may show the following behaviors: Risk of out-of-home placement due to criminality Physical aggression (in home school or community) Verbal aggression and threats of harm to others Substance abuse along with antisocial behaviors  What is Multi-systemic Therapy (MST)? Multi-systemic Therapy (MST) is an intensive family, home, and community-based treatment program designed to address serious problem behaviors of youth between the ages of 38 through 53. The 3 to 15-month long program targets behaviors, including, but not limited to, aggressive/violent acting out, and substance abuse. Research studies of troubled youth are clear, juvenile problem behavior is closely related to difficulties in the following areas: Family Relations School Performance Peer Relations Neighborhood and community relations  What is Contingency Management (CM) for Substance Abuse Treatment? Contingency management is a behavioral therapy that uses positive reinforcement in the form of motivational incentives and tangible rewards as tools to assist an individual in achieving abstinent from drugs or alcohol. Contingency management interventions are based on principles of basic behavioral analysis which includes the premise that A behavior that is reinforced in close temporal proximity to its occurrence will increase in frequency.  Outpatient Therapy Services Comprehensive Clinical Assessments Substance Abuse Treatment Substance Abuse Assessments Trauma-Focused Cognitive Behavioral Therapy TF-CBT Intensive Outpatient  Services Individual Therapy Family Therapy Group Therapy Couples Therapy EAP Services  Enhanced Community-Based Services Multi-systemic Therapy (MST) MST for Youth with Problem Sexual Behavior (MST-PSB) School-Based Stabilization Therapy Case Management Services Substance Use Testing Partnership Services Medication Management Support Psychiatric Evaluation Psychological Testing Coordination of Care Services      Multi-systemic Therapy -  A program for teens who display aggression/violence, substance use, and other delinquent behaviors.

## 2023-09-29 DIAGNOSIS — F411 Generalized anxiety disorder: Secondary | ICD-10-CM | POA: Diagnosis not present

## 2023-09-29 DIAGNOSIS — F410 Panic disorder [episodic paroxysmal anxiety] without agoraphobia: Secondary | ICD-10-CM | POA: Diagnosis not present

## 2023-09-30 ENCOUNTER — Other Ambulatory Visit: Payer: Self-pay | Admitting: Internal Medicine

## 2023-09-30 ENCOUNTER — Ambulatory Visit

## 2023-09-30 DIAGNOSIS — R112 Nausea with vomiting, unspecified: Secondary | ICD-10-CM

## 2023-09-30 DIAGNOSIS — R1013 Epigastric pain: Secondary | ICD-10-CM

## 2023-09-30 DIAGNOSIS — R109 Unspecified abdominal pain: Secondary | ICD-10-CM

## 2023-10-01 ENCOUNTER — Ambulatory Visit

## 2023-10-01 LAB — COMPREHENSIVE METABOLIC PANEL WITH GFR
AG Ratio: 1.7 (calc) (ref 1.0–2.5)
ALT: 19 U/L (ref 6–29)
AST: 21 U/L (ref 10–30)
Albumin: 4.5 g/dL (ref 3.6–5.1)
Alkaline phosphatase (APISO): 56 U/L (ref 31–125)
BUN: 10 mg/dL (ref 7–25)
CO2: 24 mmol/L (ref 20–32)
Calcium: 9.7 mg/dL (ref 8.6–10.2)
Chloride: 102 mmol/L (ref 98–110)
Creat: 0.69 mg/dL (ref 0.50–0.96)
Globulin: 2.7 g/dL (ref 1.9–3.7)
Glucose, Bld: 85 mg/dL (ref 65–99)
Potassium: 3.9 mmol/L (ref 3.5–5.3)
Sodium: 137 mmol/L (ref 135–146)
Total Bilirubin: 0.9 mg/dL (ref 0.2–1.2)
Total Protein: 7.2 g/dL (ref 6.1–8.1)
eGFR: 126 mL/min/{1.73_m2} (ref >=60)

## 2023-10-01 LAB — MAGNESIUM: Magnesium: 2.1 mg/dL (ref 1.5–2.5)

## 2023-10-01 LAB — CBC WITH DIFFERENTIAL/PLATELET
Absolute Lymphocytes: 1249 {cells}/uL (ref 850–3900)
Absolute Monocytes: 364 {cells}/uL (ref 200–950)
Basophils Absolute: 28 {cells}/uL (ref 0–200)
Basophils Relative: 0.5 %
Eosinophils Absolute: 22 {cells}/uL (ref 15–500)
Eosinophils Relative: 0.4 %
HCT: 42.1 % (ref 35.0–45.0)
Hemoglobin: 14.4 g/dL (ref 11.7–15.5)
MCH: 30.5 pg (ref 27.0–33.0)
MCHC: 34.2 g/dL (ref 32.0–36.0)
MCV: 89.2 fL (ref 80.0–100.0)
MPV: 9.1 fL (ref 7.5–12.5)
Monocytes Relative: 6.5 %
Neutro Abs: 3937 {cells}/uL (ref 1500–7800)
Neutrophils Relative %: 70.3 %
Platelets: 274 10*3/uL (ref 140–400)
RBC: 4.72 10*6/uL (ref 3.80–5.10)
RDW: 12.7 % (ref 11.0–15.0)
Total Lymphocyte: 22.3 %
WBC: 5.6 10*3/uL (ref 3.8–10.8)

## 2023-10-01 LAB — DRUG MONITOR, PANEL 5, W/CONF, URINE
Amphetamine: 1148 ng/mL — ABNORMAL HIGH (ref <250)
Amphetamines: POSITIVE ng/mL — AB (ref <500)
Barbiturates: NEGATIVE ng/mL (ref <300)
Benzodiazepines: NEGATIVE ng/mL (ref <100)
Cocaine Metabolite: NEGATIVE ng/mL (ref <150)
Creatinine: 134.7 mg/dL (ref >=20.0)
Marijuana Metabolite: NEGATIVE ng/mL (ref <20)
Marijuana Metabolite: NEGATIVE ng/mL (ref <5)
Methadone Metabolite: NEGATIVE ng/mL (ref <100)
Methamphetamine: NEGATIVE ng/mL (ref <250)
Opiates: NEGATIVE ng/mL (ref <100)
Oxidant: NEGATIVE ug/mL (ref <200)
Oxycodone: NEGATIVE ng/mL (ref <100)
pH: 8.2 (ref 4.5–9.0)

## 2023-10-01 LAB — LIPID PANEL
Cholesterol: 157 mg/dL (ref <200)
HDL: 77 mg/dL (ref >=50)
LDL Cholesterol (Calc): 68 mg/dL
Non-HDL Cholesterol (Calc): 80 mg/dL (ref <130)
Total CHOL/HDL Ratio: 2 (calc) (ref <5.0)
Triglycerides: 41 mg/dL (ref <150)

## 2023-10-01 LAB — TSH: TSH: 0.79 m[IU]/L

## 2023-10-01 LAB — HEMOGLOBIN A1C
Hgb A1c MFr Bld: 5.1 % (ref <5.7)
Mean Plasma Glucose: 100 mg/dL
eAG (mmol/L): 5.5 mmol/L

## 2023-10-01 LAB — PREGNANCY, URINE: Preg Test, Ur: NEGATIVE

## 2023-10-01 LAB — PRESCRIBED DRUGS,MEDMATCH(R)

## 2023-10-01 LAB — DM TEMPLATE

## 2023-10-01 LAB — PROLACTIN: Prolactin: 10 ng/mL

## 2023-10-06 ENCOUNTER — Ambulatory Visit (INDEPENDENT_AMBULATORY_CARE_PROVIDER_SITE_OTHER)

## 2023-10-06 DIAGNOSIS — R1013 Epigastric pain: Secondary | ICD-10-CM

## 2023-10-06 NOTE — Progress Notes (Signed)
 Patient is in office today for a nurse visit for  EKG . Patient reports EKG is needed per psychiatry orders , will send to PCP to review.

## 2023-10-10 ENCOUNTER — Ambulatory Visit (HOSPITAL_COMMUNITY): Admitting: Clinical

## 2023-10-13 ENCOUNTER — Telehealth (HOSPITAL_COMMUNITY): Admitting: Registered Nurse

## 2023-10-13 ENCOUNTER — Encounter (HOSPITAL_COMMUNITY): Payer: Self-pay | Admitting: Registered Nurse

## 2023-10-13 DIAGNOSIS — F410 Panic disorder [episodic paroxysmal anxiety] without agoraphobia: Secondary | ICD-10-CM

## 2023-10-13 DIAGNOSIS — G4700 Insomnia, unspecified: Secondary | ICD-10-CM

## 2023-10-13 DIAGNOSIS — F330 Major depressive disorder, recurrent, mild: Secondary | ICD-10-CM

## 2023-10-13 DIAGNOSIS — F4310 Post-traumatic stress disorder, unspecified: Secondary | ICD-10-CM | POA: Diagnosis not present

## 2023-10-13 DIAGNOSIS — F902 Attention-deficit hyperactivity disorder, combined type: Secondary | ICD-10-CM

## 2023-10-13 DIAGNOSIS — F50819 Binge eating disorder, unspecified: Secondary | ICD-10-CM

## 2023-10-13 DIAGNOSIS — F411 Generalized anxiety disorder: Secondary | ICD-10-CM | POA: Diagnosis not present

## 2023-10-13 MED ORDER — FLUOXETINE HCL 10 MG PO CAPS
10.0000 mg | ORAL_CAPSULE | Freq: Every day | ORAL | 2 refills | Status: AC
Start: 2023-10-13 — End: ?

## 2023-10-13 MED ORDER — HYDROXYZINE HCL 10 MG PO TABS: ORAL_TABLET | ORAL | 2 refills | Status: DC

## 2023-10-13 MED ORDER — LISDEXAMFETAMINE DIMESYLATE 40 MG PO CAPS
40.0000 mg | ORAL_CAPSULE | Freq: Every day | ORAL | 0 refills | Status: DC
Start: 2023-10-13 — End: 2023-12-04

## 2023-10-13 NOTE — Progress Notes (Signed)
 BH MD/PA/NP OP Progress Note  10/13/2023 3:06 PM Patty Gomez  MRN:  [id number]  Virtual Visit via Video Note  I connected with Patty Gomez on 10/13/23 at  2:30 PM EDT by a video enabled telemedicine application and verified that I am speaking with the correct person using two identifiers.  Location: Patient: Home Provider: Al[place] Jerry, [place]   I discussed the limitations of evaluation and management by telemedicine and the availability of in person appointments. The patient expressed understanding and agreed to proceed.    I discussed the assessment and treatment plan with the patient. The patient was provided an opportunity to ask questions and all were answered. The patient agreed with the plan and demonstrated an understanding of the instructions.   The patient was advised to call back or seek an in-person evaluation if the symptoms worsen or if the condition fails to improve as anticipated.  I provided 30 minutes of non-face-to-face time during this encounter.   Patty Magnus, NP   Chief Complaint: No chief complaint on file.  HPI: Patty Gomez 23 y.o. female presents today for medication management follow up.  She is seen via virtual video visit by this provider, and chart reviewed on 10/13/23.  Her psychiatric history is significant for PTSD, ADHD, major depressive disorder, generalized anxiety disorder with panic attacks, and binge eating. Patty Gomez  Her mental health is currently managed with Prozac  20 mg daily, Vistaril  10-20 mg Tid prn, and Vyvanse  30 mg daily.  She reports current medication regimen is managing mental health well without adverse reaction.  However she states that the Vyvanse  is helping and has had some improvement. States prior to stopping she was taking Vyvanse  60 mg daily.  Also discussed amphetamine noted to be high on urine drug screen.  Reordered to rule out miss use.  Reports she continues to do jail time on the weekends but is not allowed to take any of her  medications.  States she has 7 more weeks to do.   Today she denies suicidal/self-harm/homicidal ideation, psychosis, paranoia, and abnormal movement.  She states she has noticed improvement in depress/anxiety symptoms, eating without difficulty and improvement in sleep.       Recommended the following:  Increased Vyvanse  to 40 mg daily.  Continue Prozac  20 mg and Vistaril  10-20 mg Tid prn.  She voices understanding with information being given to her today and is agreeable to recommendations.    Visit Diagnosis:    ICD-10-CM   1. Major depressive disorder, recurrent, mild (HCC)  F33.0 FLUoxetine  (PROZAC ) 10 MG capsule    2. Generalized anxiety disorder with panic attacks  F41.1 hydrOXYzine  (ATARAX ) 10 MG tablet   F41.0 FLUoxetine  (PROZAC ) 10 MG capsule    3. Insomnia, unspecified type  G47.00 hydrOXYzine  (ATARAX ) 10 MG tablet    4. Binge eating disorder, unspecified severity  F50.819 FLUoxetine  (PROZAC ) 10 MG capsule    lisdexamfetamine (VYVANSE ) 40 MG capsule    5. PTSD (post-traumatic stress disorder)  F43.10 FLUoxetine  (PROZAC ) 10 MG capsule    6. Attention deficit hyperactivity disorder (ADHD), combined type  F90.2 lisdexamfetamine (VYVANSE ) 40 MG capsule    Drugs of abuse screen w/o alc (for BH OP)      Past Psychiatric History: PTSD, ADHD, major depressive disorder, generalized anxiety disorder with panic attacks, and binge eating   Past Medical History:  Past Medical History:  Diagnosis Date   Acute bronchitis 01/20/2013   Anxiety state 08/22/2015   Depression    Encounter for IUD  insertion 01/27/2018   Hair loss 03/11/2022   Iron deficiency 05/15/2022   Migraines    Pain and swelling of toe of left foot 02/11/2022   Preventative health care 02/11/2022   Screening examination for STD (sexually transmitted disease) 03/20/2021   Trichimoniasis    Vaginal burning 03/20/2021   Vaginal discharge 03/20/2021   Vaginal irritation 06/04/2021    Past Surgical History:   Procedure Laterality Date   BIOPSY  05/07/2019   Procedure: BIOPSY;  Surgeon: Alyce Jubilee, MD;  [facility];  Service: Endoscopy;;   BIOPSY  08/23/2022   Procedure: BIOPSY;  Surgeon: Vinetta Greening, DO;  [facility];  Service: Endoscopy;;   COLONOSCOPY     ESOPHAGOGASTRODUODENOSCOPY N/A 05/07/2019   Procedure: ESOPHAGOGASTRODUODENOSCOPY (EGD);  Surgeon: Alyce Jubilee, MD;  [facility];  Service: Endoscopy;  Laterality: N/A;  3:00pm   ESOPHAGOGASTRODUODENOSCOPY (EGD) WITH PROPOFOL  N/A 08/23/2022   Procedure: ESOPHAGOGASTRODUODENOSCOPY (EGD) WITH PROPOFOL ;  Surgeon: Vinetta Greening, DO;  [facility];  Service: Endoscopy;  Laterality: N/A;  1:15 pm ,asa 2   TONSILLECTOMY      Family Psychiatric History: See below in family history  Family History:  Family History  Problem Relation Age of Onset   Bipolar disorder Mother    Cancer Mother    Hypertension Paternal Grandmother    Diabetes Paternal Grandmother    Hyperlipidemia Paternal Grandmother    Heart disease Paternal Grandmother    Colon polyps Paternal Grandmother    Diabetes Other    Cancer Other    Colon cancer Neg Hx     Social History:  Social History   Socioeconomic History   Marital status: Single    Spouse name: Not on file   Number of children: Not on file   Years of education: Not on file   Highest education level: Not on file  Occupational History   Not on file  Tobacco Use   Smoking status: Some Days    Types: E-cigarettes    Last attempt to quit: 05/2022    Years since quitting: 1.4   Smokeless tobacco: Never  Vaping Use   Vaping status: Some Days  Substance and Sexual Activity   Alcohol use: Yes    Comment: occ   Drug use: Not Currently    Types: Marijuana    Comment: tried marijuana a few times as a teenager; hit vape pen with THC in 2024 by accident   Sexual activity: Yes    Birth control/protection: I.U.D.  Other Topics Concern   Not on  file  Social History Narrative   ** Merged History Encounter **       Lives with her grandmother. [facility] in spring 2021.    Social Drivers of Corporate investment banker Strain: Low Risk  (04/28/2023)   Overall Financial Resource Strain (CARDIA)    Difficulty of Paying Living Expenses: Not very hard  Food Insecurity: No Food Insecurity (04/28/2023)   Hunger Vital Sign    Worried About Running Out of Food in the Last Year: Never true    Ran Out of Food in the Last Year: Never true  Transportation Needs: No Transportation Needs (04/28/2023)   PRAPARE - Administrator, Civil Service (Medical): No    Lack of Transportation (Non-Medical): No  Physical Activity: Insufficiently Active (04/28/2023)   Exercise Vital Sign    Days of Exercise per Week: 1 day    Minutes of Exercise  per Session: 10 min  Stress: No Stress Concern Present (04/28/2023)   [employer] of Occupational Health - Occupational Stress Questionnaire    Feeling of Stress : Not at all  Social Connections: Socially Isolated (04/28/2023)   Social Connection and Isolation Panel [NHANES]    Frequency of Communication with Friends and Family: Never    Frequency of Social Gatherings with Friends and Family: Never    Attends Religious Services: 1 to 4 times per year    Active Member of [employer] or Organizations: No    Attends Banker Meetings: Never    Marital Status: Never married    Allergies:  Allergies  Allergen Reactions   Dust Mite Extract     Metabolic Disorder Labs: Lab Results  Component Value Date   HGBA1C 5.1 09/29/2023   MPG 100 09/29/2023   Lab Results  Component Value Date   PROLACTIN 10.0 09/29/2023   Lab Results  Component Value Date   CHOL 157 09/29/2023   TRIG 41 09/29/2023   HDL 77 09/29/2023   CHOLHDL 2.0 09/29/2023   LDLCALC 68 09/29/2023   LDLCALC 58 10/17/2022   Lab Results  Component Value Date   TSH 0.79 09/29/2023   TSH 0.953 10/17/2022    Current  Medications: Current Outpatient Medications  Medication Sig Dispense Refill   dicyclomine  (BENTYL ) 10 MG capsule TAKE ONE CAPSULE BY MOUTH FOUR TIMES DAILY BEFORE MEALS AND AT BEDTIME 30 capsule 1   FLUoxetine  (PROZAC ) 10 MG capsule Take 1 capsule (10 mg total) by mouth daily. 30 capsule 2   hydrOXYzine  (ATARAX ) 10 MG tablet Take 10 - 20 mg (1 to 2 tablets) three times a day as needed for anxiety/sleep 90 tablet 2   levonorgestrel  (LILETTA ) 19.5 MCG/DAY IUD IUD 1 each by Intrauterine route once.     lisdexamfetamine (VYVANSE ) 40 MG capsule Take 1 capsule (40 mg total) by mouth daily. 30 capsule 0   ondansetron  (ZOFRAN ) 4 MG tablet TAKE ONE TABLET BY MOUTH EVERY 8 HOURS AS NEEDED FOR NAUSEA AND VOMITING 20 tablet 0   pantoprazole  (PROTONIX ) 40 MG tablet Take 1 tablet (40 mg total) by mouth 2 (two) times daily. 60 tablet 2   No current facility-administered medications for this visit.     Musculoskeletal: Strength & Muscle Tone:  Unable to assess via virtual visit Gait & Station:  Unable to assess via virtual visit Patient leans: N/A  Psychiatric Specialty Exam: Review of Systems  Constitutional:        No other complaints voiced  Psychiatric/Behavioral:  Positive for decreased concentration (Improved) and dysphoric mood (Improved). Negative for agitation, hallucinations, sleep disturbance and suicidal ideas. The patient is nervous/anxious (Improved).   All other systems reviewed and are negative.   There were no vitals taken for this visit.There is no height or weight on file to calculate BMI.  General Appearance: Casual  Eye Contact:  Good  Speech:  Clear and Coherent and Normal Rate  Volume:  Normal  Mood:  Euthymic  Affect:  Appropriate and Congruent  Thought Process:  Coherent, Goal Directed, and Descriptions of Associations: Intact  Orientation:  Full (Time, Place, and Person)  Thought Content: WDL and Logical   Suicidal Thoughts:  No  Homicidal Thoughts:  No  Memory:   Immediate;   Good Recent;   Good Remote;   Good  Judgement:  Intact  Insight:  Present  Psychomotor Activity:  Normal  Concentration:  Concentration: Good and Attention Span: Good  Recall:  Good  Fund of Knowledge: Good  Language: Good  Akathisia:  No  Handed:  Right  AIMS (if indicated): not done  Assets:  Communication Skills Desire for Improvement Housing Leisure Time Physical Health Resilience Social Support Transportation  ADL's:  Intact  Cognition: WNL  Sleep:  Good   Screenings: GAD-7    Flowsheet Row Office Visit from 09/25/2023 in [facility] [place] Office Visit from 04/28/2023 in [facility] Office Visit from 01/17/2023 in [facility] Office Visit from 10/17/2022 in [facility] Office Visit from 07/19/2022 in [facility]  Total GAD-7 Score 19 0 15 16 0      PHQ2-9    Flowsheet Row Office Visit from 09/25/2023 in [facility] [place] Office Visit from 08/01/2023 in [facility] Office Visit from 04/28/2023 in [facility] Office Visit from 01/17/2023 in [facility] Office Visit from 10/17/2022 in [facility] [place] [facility]  PHQ-2 Total Score 2 0 0 0 5  PHQ-9 Total Score 11 -- 0 0 18      Flowsheet Row Office Visit from 09/25/2023 in [facility] [place] Admission (Discharged) from 08/23/2022 in [facility] Counselor from 03/22/2022 in [facility] [place]  C-SSRS RISK CATEGORY No Risk No Risk No Risk        Assessment and Plan: Assessment: Patient seen and examined as noted above. Summary: Today Patty Gomez appears to be doing well.    She reports medications are effective and managing her mental health without any  adverse reaction.  Reporting improvement in depression, anxiety, mood and sleep. She denies suicidal/self-harm/homicidal ideation, psychosis, paranoia, and fluctuations in mood.    During visit she is dressed appropriate for age and weather.  She is seated comfortably in view of camera with no noted distress.  She is alert/oriented x 4, calm/cooperative and mood is congruent with affect.  She spoke in a clear tone at moderate volume, and normal pace, with good eye contact.  Her thought process is coherent, relevant, and there is no indication that she is currently responding to internal/external stimuli or experiencing delusional thought content.    1. Generalized anxiety disorder with panic attacks - hydrOXYzine  (ATARAX ) 10 MG tablet; Take 10 - 20 mg (1 to 2 tablets) three times a day as needed for anxiety/sleep  Dispense: 90 tablet; Refill: 2 - FLUoxetine  (PROZAC ) 10 MG capsule; Take 1 capsule (10 mg total) by mouth daily.  Dispense: 30 capsule; Refill: 2  2. Insomnia, unspecified type - hydrOXYzine  (ATARAX ) 10 MG tablet; Take 10 - 20 mg (1 to 2 tablets) three times a day as needed for anxiety/sleep  Dispense: 90 tablet; Refill: 2  3. Major depressive disorder, recurrent, mild (HCC) (Primary) - FLUoxetine  (PROZAC ) 10 MG capsule; Take 1 capsule (10 mg total) by mouth daily.  Dispense: 30 capsule; Refill: 2  4. Binge eating disorder, unspecified severity - FLUoxetine  (PROZAC ) 10 MG capsule; Take 1 capsule (10 mg total) by mouth daily.  Dispense: 30 capsule; Refill: 2 - lisdexamfetamine (VYVANSE ) 40 MG capsule; Take 1 capsule (40 mg total) by mouth daily.  Dispense: 30 capsule; Refill: 0  5. PTSD (post-traumatic stress disorder) - FLUoxetine  (PROZAC ) 10 MG capsule; Take 1 capsule (10 mg total) by mouth daily.  Dispense: 30  capsule; Refill: 2  6. Attention deficit hyperactivity disorder (ADHD), combined type - lisdexamfetamine (VYVANSE ) 40 MG capsule; Take 1 capsule (40 mg total) by mouth daily.   Dispense: 30 capsule; Refill: 0 - Drugs of abuse screen w/o alc (for BH OP)  Plan: Medications: Meds ordered this encounter  Medications   hydrOXYzine  (ATARAX ) 10 MG tablet    Sig: Take 10 - 20 mg (1 to 2 tablets) three times a day as needed for anxiety/sleep    Dispense:  90 tablet    Refill:  2    Supervising Provider:   Eduard Grad Gomez [[id number]]   FLUoxetine  (PROZAC ) 10 MG capsule    Sig: Take 1 capsule (10 mg total) by mouth daily.    Dispense:  30 capsule    Refill:  2    Supervising Provider:   ARFEEN, Patty Gomez [[id number]]   lisdexamfetamine (VYVANSE ) 40 MG capsule    Sig: Take 1 capsule (40 mg total) by mouth daily.    Dispense:  30 capsule    Refill:  0    Supervising Provider:   Eduard Grad Gomez [[id number]]    Labs:  Reviewed and discussed with patient.  Reorder of urine drug screen related to elevated amphetamine level, to rule out mis use.  May require a pill count at next visit.    Lab Orders         Drugs of abuse screen w/o alc (for BH OP)     Other:  Continue counseling/therapy.   Patty Gomez is instructed to call 911, [phone], mobile crisis, or present to the nearest emergency room should she experience any suicidal/homicidal ideation, auditory/visual/hallucinations, or detrimental worsening of her mental health condition.  Patty Gomez has participated in the development of this treatment plan and verbalized her agreement with plan as listed.  Follow Up: Return in  Call in the interim for any side-effects, decompensation, questions, or problems  Collaboration of Care: Collaboration of Care: Medication Management AEB Medication adjustment and refill and Other UDS ordered  Patient/Guardian was advised Release of Information must be obtained prior to any record release in order to collaborate their care with an outside provider. Patient/Guardian was advised if they have not already done so to contact the registration department to sign all necessary forms in order for us  to release  information regarding their care.   Consent: Patient/Guardian gives verbal consent for treatment and assignment of benefits for services provided during this visit. Patient/Guardian expressed understanding and agreed to proceed.    Patty Schetter, NP 10/13/2023, 3:06 PM

## 2023-10-20 ENCOUNTER — Other Ambulatory Visit: Payer: Self-pay | Admitting: Internal Medicine

## 2023-10-20 DIAGNOSIS — R109 Unspecified abdominal pain: Secondary | ICD-10-CM

## 2023-10-20 DIAGNOSIS — R112 Nausea with vomiting, unspecified: Secondary | ICD-10-CM

## 2023-10-20 DIAGNOSIS — R1013 Epigastric pain: Secondary | ICD-10-CM

## 2023-10-27 ENCOUNTER — Ambulatory Visit (INDEPENDENT_AMBULATORY_CARE_PROVIDER_SITE_OTHER): Admitting: Clinical

## 2023-10-27 DIAGNOSIS — F4310 Post-traumatic stress disorder, unspecified: Secondary | ICD-10-CM | POA: Diagnosis not present

## 2023-10-27 DIAGNOSIS — F902 Attention-deficit hyperactivity disorder, combined type: Secondary | ICD-10-CM

## 2023-10-27 DIAGNOSIS — F331 Major depressive disorder, recurrent, moderate: Secondary | ICD-10-CM

## 2023-10-27 DIAGNOSIS — F411 Generalized anxiety disorder: Secondary | ICD-10-CM

## 2023-10-27 DIAGNOSIS — F410 Panic disorder [episodic paroxysmal anxiety] without agoraphobia: Secondary | ICD-10-CM | POA: Diagnosis not present

## 2023-10-27 NOTE — Progress Notes (Signed)
 Virtual Visit via Video Note  I connected with Patty Gomez on 10/27/23 at 11:00 AM EDT by a video enabled telemedicine application and verified that I am speaking with the correct person using two identifiers.  Location: Patient: home Provider: office   I discussed the limitations of evaluation and management by telemedicine and the availability of in person appointments. The patient expressed understanding and agreed to proceed.   THERAPY PROGRESS NOTE   Session Time: 11:00 AM- 11:45 AM   Participation Level: Active   Behavioral Response: CasualAlert/Hyperverbal   Type of Therapy: Individual Therapy   Treatment Goals addressed: Coping   Interventions: CBT, DBT, Solution Focused, Strength-based and Supportive   Summary: Patty Gomez is a 23y.o. female who presents with Depression, Anxiety, PTSD, ADHD . The OPT therapist worked with the patient for her OPT treatment session. The OPT therapist utilized Motivational Interviewing to assist in creating therapeutic repore. The patient in the session was engaged and work in collaboration giving feedback about her triggers and symptoms over the past few weeks. The patient noted she has continued to check in with her probation office on a weekly basis. The patient spoke about serving  weekends of her sentence during the past few weekends the patient notes she has 6 weeks remaining of her weekends in jail sentence to complete.The patient spoke about getting transition from Dr. Cathyann Gomez to Patty Gomez for her med management/psychiatry. The patient spoke about her ex-boyfriend messaging her and this being a stressor. The patient spoke about her experiences in [place] over the weekends including homosexual advances, quality of food and drink, and struggle with disassociation and excessive sleeping. The patient spoke about her difficulty not being able to talk to or check on her grandmother during the weekends in [place].The patient spoke about her physical  health as well as her response to her current medication management. The patient spoke about self stopping her Prozac  due to side effect of tremors. The patient spoke about her efforts to get out of the house more and interact with friends going [name] more frequently. The patient spoke about her plans to celebrate her birthday tomorrow turning 23.   Suicidal/Homicidal: Nowithout intent/plan   Therapist Response:The OPT therapist worked with the patient for the patients scheduled session. The patient was engaged in her session and gave feedback in relation to triggers, symptoms, and behavior responses over the past few weeks. The patient spoke about her ongoing experience in [place] during the weekends and is now down to 6 weekends to serve to finish her sentence.  The OPT therapist worked with the patient utilizing an in session Cognitive Behavioral Therapy exercise. The patient was responsive in the session and verbalized, " I decided to stop my Prozac  because I noted having tremors".   I have recently been Bowling and I am wanting to go Golfing.  The OPT therapist worked with the patient overviewing basic health areas including sleep cycle, eating habits, hygiene, and physical exercise. The patient in this session verbalized no current S/I or H/I. The OPT therapist worked with the patient on planning and working on her goals post finishing her legal requirements.  The patient spoke about her work with psychiatrist Patty Gomez. The patient spoke about utilizing a pill organizer to help her manage her medication.The patient spoke about her own introspective in working towards stabilization and her perspective from starting her weekends in [place] to being close now to completing her requirements and getting herself out of legal involvement. The patient spoke  about the impact of her interactions with her cell mate on her MH. The patient spoke about the tremors stopping once she stopped taking the Prozac .  The OPT  therapist worked with the patient on her introspection of going through the hardship of serving [name] time.  The patient spoke about being thankful for being allowed to serve weekends and not having to serve her full potential sentence of 63yrs of consistent Jail time. The patient spoke about her work to get out more often and use coping skills. The patient spoke about her interactions with family and her concern about her grandmothers health.The patient spoke about her plans for her upcoming birthday including Grilling Out at home to celebrate with her grandparents for her birthday. The patient spoke about how important her support with her Grandmother is in part due to having lost so many other family members. The patient spoke about her goal of getting a job and will be working towards this goal.  The patient noted again she just has 6 weekends remaining and completion of DUI class and she will have completed her requirements post going back to court. The patient spoke about her daily affirmations and this helping her to have more positive thinking. The patient spoke about eating better. The patient spoke about limiting her TV time and journaling and reading more. The patient spoke about her faith being a large protective factor and coping skill for her in her bible study.The OPT therapist will continue treatment work with the patient in her next scheduled session.   Plan: Return again in 2/3 weeks.   Diagnosis:      Axis I:Depression/ GAD/PTSD/ADHD                           Axis II: No diagnosis       Collaboration of Care: No additional collaboration for this session.   Patient/Guardian was advised Release of Information must be obtained prior to any record release in order to collaborate their care with an outside provider. Patient/Guardian was advised if they have not already done so to contact the registration department to sign all necessary forms in order for us  to release information regarding  their care.    Consent: Patient/Guardian gives verbal consent for treatment and assignment of benefits for services provided during this visit. Patient/Guardian expressed understanding and agreed to proceed.    I discussed the assessment and treatment plan with the patient. The patient was provided an opportunity to ask questions and all were answered. The patient agreed with the plan and demonstrated an understanding of the instructions.   The patient was advised to call back or seek an in-person evaluation if the symptoms worsen or if the condition fails to improve as anticipated.   I provided 45 minutes of non-face-to-face time during this encounter.   Secundino Dach, LCSW   10/27/2023

## 2023-11-04 DIAGNOSIS — H5213 Myopia, bilateral: Secondary | ICD-10-CM | POA: Diagnosis not present

## 2023-11-09 ENCOUNTER — Other Ambulatory Visit: Payer: Self-pay | Admitting: Internal Medicine

## 2023-11-09 DIAGNOSIS — R109 Unspecified abdominal pain: Secondary | ICD-10-CM

## 2023-11-11 ENCOUNTER — Telehealth (HOSPITAL_COMMUNITY): Payer: Self-pay | Admitting: *Deleted

## 2023-11-11 NOTE — Telephone Encounter (Signed)
 Patient called stating that provider informed her that one of her scores on her labs was very high and wanted her to redo the labs again. Per pt she do not know which test it was.

## 2023-11-13 ENCOUNTER — Telehealth (HOSPITAL_COMMUNITY): Admitting: Registered Nurse

## 2023-11-14 ENCOUNTER — Telehealth: Payer: Self-pay

## 2023-11-14 NOTE — Telephone Encounter (Signed)
 Spoke to patient

## 2023-11-14 NOTE — Telephone Encounter (Signed)
 Copied from CRM [id number]. Topic: Clinical - Lab/Test Results >> Nov 14, 2023  8:56 AM El Gravely T wrote: Reason for CRM: Patient calling to discuss findings from EKG on 10/06/23.  Patient requesting a return call to discuss to results further.  Per patient ok to speak with Legal guardian, grandmother, Tammie Loeper at [phone] [facility] verified)

## 2023-11-17 ENCOUNTER — Telehealth (HOSPITAL_COMMUNITY): Payer: Self-pay | Admitting: *Deleted

## 2023-11-17 NOTE — Telephone Encounter (Signed)
 Patient called stating she is needing refills for her Vyvanse  to be sent to [facility].

## 2023-11-24 ENCOUNTER — Ambulatory Visit (INDEPENDENT_AMBULATORY_CARE_PROVIDER_SITE_OTHER): Admitting: Clinical

## 2023-11-24 DIAGNOSIS — F902 Attention-deficit hyperactivity disorder, combined type: Secondary | ICD-10-CM

## 2023-11-24 DIAGNOSIS — F4310 Post-traumatic stress disorder, unspecified: Secondary | ICD-10-CM

## 2023-11-24 DIAGNOSIS — F411 Generalized anxiety disorder: Secondary | ICD-10-CM | POA: Diagnosis not present

## 2023-11-24 DIAGNOSIS — F410 Panic disorder [episodic paroxysmal anxiety] without agoraphobia: Secondary | ICD-10-CM | POA: Diagnosis not present

## 2023-11-24 DIAGNOSIS — F331 Major depressive disorder, recurrent, moderate: Secondary | ICD-10-CM

## 2023-11-24 NOTE — Telephone Encounter (Signed)
 Patient is aware

## 2023-11-24 NOTE — Progress Notes (Signed)
 Virtual Visit via Video Note   I connected with Patty Gomez on 11/24/23 at 11:00 AM EDT by a video enabled telemedicine application and verified that I am speaking with the correct person using two identifiers.   Location: Patient: home Provider: office   I discussed the limitations of evaluation and management by telemedicine and the availability of in person appointments. The patient expressed understanding and agreed to proceed.     THERAPY PROGRESS NOTE   Session Time: 11:00 AM- 11:30 AM   Participation Level: Active   Behavioral Response: CasualAlert/Hyperverbal   Type of Therapy: Individual Therapy   Treatment Goals addressed: Coping   Interventions: CBT, DBT, Solution Focused, Strength-based and Supportive   Summary: Patty Gomez is a 23y.o. female who presents with Depression, Anxiety, PTSD, ADHD . The OPT therapist worked with the patient for her OPT treatment session. The OPT therapist utilized Motivational Interviewing to assist in creating therapeutic repore. The patient in the session was engaged and work in collaboration giving feedback about her triggers and symptoms over the past few weeks. The patient noted she has continued to check in with her probation office on a weekly basis. The patient spoke about serving  weekends of her sentence during the past few weekends the patient notes she has 2 weeks remaining of her weekends in jail sentence to complete.The patient spoke about  transition from [name] to [name] for her med management/psychiatry. The patient spoke about positive changes since her last session including getting a job at a [employer], getting into a new relationship, and getting closer to finishing her weekends in [place] The patient spoke about her plans to get back into exercising.    Suicidal/Homicidal: Nowithout intent/plan   Therapist Response:The OPT therapist worked with the patient for the patients scheduled session. The patient  was engaged in her session and gave feedback in relation to triggers, symptoms, and behavior responses over the past few weeks. The patient spoke about her ongoing experience in [place] during the weekends and is now down to 2 weekends to serve to finish her sentence.  The OPT therapist worked with the patient utilizing an in session Cognitive Behavioral Therapy exercise. The patient was responsive in the session and verbalized, " I am doing really good I only have 2 weekends left and I got a job and I have a new boyfriend". The OPT therapist worked with the patient overviewing basic health areas including sleep cycle, eating habits, hygiene, and physical exercise. The patient in this session verbalized no current S/I or H/I. The OPT therapist worked with the patient on planning and working on her goals post finishing her legal requirements.  The patient spoke about her work with psychiatrist [name]. The patient spoke about utilizing a pill organizer to help her manage her medication.The patient spoke about her own introspective in working towards stabilization and her perspective from starting her weekends in [place] to being close now to completing her requirements and getting herself out of legal involvement. The patient spoke about the impact of her interactions with her cell mate on her MH. The patient spoke about working on being more consistent with her medication.  The OPT therapist worked with the patient on her introspection of going through the hardship of serving [place] time.  The patient spoke about enjoying going Bowling a lot at [facility] and recently getting back into the [place]. The patient spoke about her work to get out more often and use coping  skills. The patient spoke about her daily affirmations and this helping her to have more positive thinking. The patient spoke about eating better. The patient spoke about ongoing limiting her TV time, however, doing more gaming on the Wii System  and journaling and reading more. The patient spoke about her faith being a large protective factor and coping skills for her in her bible study. The OPT therapist worked with the patient promoting her compliance with med management. The patient spoke about getting new glasses. The patient spoke about her realization of her strength as a person and a inspiration to others.The patient spoke about her The OPT therapist will continue treatment work with the patient in her next scheduled session.   Plan: Return again in 2/3 weeks.   Diagnosis:      Axis I:Depression/ GAD/PTSD/ADHD                           Axis II: No diagnosis       Collaboration of Care:  Overview of the patient involvement in the Med Management   Patient/Guardian was advised Release of Information must be obtained prior to any record release in order to collaborate their care with an outside provider. Patient/Guardian was advised if they have not already done so to contact the registration department to sign all necessary forms in order for us  to release information regarding their care.    Consent: Patient/Guardian gives verbal consent for treatment and assignment of benefits for services provided during this visit. Patient/Guardian expressed understanding and agreed to proceed.    I discussed the assessment and treatment plan with the patient. The patient was provided an opportunity to ask questions and all were answered. The patient agreed with the plan and demonstrated an understanding of the instructions.   The patient was advised to call back or seek an in-person evaluation if the symptoms worsen or if the condition fails to improve as anticipated.   I provided 30 minutes of non-face-to-face time during this encounter.   Secundino Dach, LCSW   11/24/2023

## 2023-12-02 ENCOUNTER — Ambulatory Visit (INDEPENDENT_AMBULATORY_CARE_PROVIDER_SITE_OTHER): Admitting: Gastroenterology

## 2023-12-02 ENCOUNTER — Encounter: Payer: Self-pay | Admitting: Gastroenterology

## 2023-12-02 VITALS — BP 114/79 | HR 89 | Temp 98.5°F | Ht 69.5 in | Wt 195.0 lb

## 2023-12-02 DIAGNOSIS — K219 Gastro-esophageal reflux disease without esophagitis: Secondary | ICD-10-CM

## 2023-12-02 DIAGNOSIS — R112 Nausea with vomiting, unspecified: Secondary | ICD-10-CM

## 2023-12-02 DIAGNOSIS — R109 Unspecified abdominal pain: Secondary | ICD-10-CM

## 2023-12-02 DIAGNOSIS — R1013 Epigastric pain: Secondary | ICD-10-CM

## 2023-12-02 DIAGNOSIS — L659 Nonscarring hair loss, unspecified: Secondary | ICD-10-CM

## 2023-12-02 MED ORDER — DICYCLOMINE HCL 10 MG PO CAPS
10.0000 mg | ORAL_CAPSULE | Freq: Three times a day (TID) | ORAL | 1 refills | Status: DC
Start: 2023-12-02 — End: 2024-01-19

## 2023-12-02 MED ORDER — PANTOPRAZOLE SODIUM 40 MG PO TBEC: 40.0000 mg | DELAYED_RELEASE_TABLET | Freq: Two times a day (BID) | ORAL | 3 refills | Status: AC

## 2023-12-02 MED ORDER — ONDANSETRON HCL 4 MG PO TABS: 4.0000 mg | ORAL_TABLET | Freq: Three times a day (TID) | ORAL | 2 refills | Status: AC | PRN

## 2023-12-02 NOTE — Progress Notes (Unsigned)
 Gastroenterology Office Note     Primary Care Physician:  Tobi Fortes, MD  Primary Gastroenterologist: Dr.  Mordechai April    Chief Complaint   Chief Complaint  Patient presents with   Follow-up    3 month follow up. Pt states she is better than last time     History of Present Illness   Ethelmae Wehner is a 23 y.o. female presenting today with a history of    history of chronic abdominal pain, N/V with concerns for cyclical vomiting syndrome, diarrhea, evaluated by [facility] in 2020 and referred to [facility]. She has seen other GI providers in the interim but is keeping care here currently     Pantoprazole  BID. Amitriptyline     Dicyclomine  is rare.   Hasn't been taking prozac .   Prior evaluations: Feb 2020: negative ultrasound   EGD Nov 2020:  MILD NSAID Gastritis. Biopsied. Negative    Dec 2020: negative GES   CT abd/pelvis without contrast in Oct 2023: unrevealing except for possible functional right ovarian cyst.    06/07/19 CTE 1.  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 No discrete lead points are identified. No associated small bowel obstruction. Attention on follow-up as clinically warranted. 2.  No evidence of active bowel inflammation.    2021: celiac serologies negative   Aug 2021 normal HBT per [facility]   Flex sig 2022 at [facility] with internal hemorrhoids s/p bandin gX 2    2024: extensive serologies (CRP, sed rate, ANA, alpha gal panel negative). Ferritin normal 102, no IDA   March 2024 normal HIDA with EF 54%   March 2024: normal esophagus s/p biopsy, gastritis s/p biopsy, no EOE, no H. Pylori.     Past Medical History:  Diagnosis Date   Acute bronchitis 01/20/2013   Anxiety state 08/22/2015   Depression    Encounter for IUD insertion 01/27/2018   Hair loss 03/11/2022   Iron deficiency 05/15/2022   Migraines     Pain and swelling of toe of left foot 02/11/2022   Preventative health care 02/11/2022   Screening examination for STD (sexually transmitted disease) 03/20/2021   Trichimoniasis    Vaginal burning 03/20/2021   Vaginal discharge 03/20/2021   Vaginal irritation 06/04/2021    Past Surgical History:  Procedure Laterality Date   BIOPSY  05/07/2019   Procedure: BIOPSY;  Surgeon: Alyce Jubilee, MD;  [facility];  Service: Endoscopy;;   BIOPSY  08/23/2022   Procedure: BIOPSY;  Surgeon: Vinetta Greening, DO;  [facility];  Service: Endoscopy;;   COLONOSCOPY     ESOPHAGOGASTRODUODENOSCOPY N/A 05/07/2019   Procedure: ESOPHAGOGASTRODUODENOSCOPY (EGD);  Surgeon: Alyce Jubilee, MD;  [facility];  Service: Endoscopy;  Laterality: N/A;  3:00pm   ESOPHAGOGASTRODUODENOSCOPY (EGD) WITH PROPOFOL  N/A 08/23/2022   Procedure: ESOPHAGOGASTRODUODENOSCOPY (EGD) WITH PROPOFOL ;  Surgeon: Vinetta Greening, DO;  [facility];  Service: Endoscopy;  Laterality: N/A;  1:15 pm ,asa 2   TONSILLECTOMY      Current Outpatient Medications  Medication Sig Dispense Refill   amitriptyline  (ELAVIL ) 25 MG tablet Take 25 mg by mouth at bedtime.     dicyclomine  (BENTYL ) 10 MG capsule TAKE ONE CAPSULE BY MOUTH FOUR TIMES DAILY BEFORE MEALS AND AT BEDTIME 30 capsule 1   hydrOXYzine  (ATARAX ) 10 MG tablet Take 10 - 20 mg (1 to 2 tablets) three  times a day as needed for anxiety/sleep 90 tablet 2   levonorgestrel  (LILETTA ) 19.5 MCG/DAY IUD IUD 1 each by Intrauterine route once.     ondansetron  (ZOFRAN ) 4 MG tablet TAKE ONE TABLET BY MOUTH EVERY 8 HOURS AS NEEDED FOR NAUSEA AND VOMITING 20 tablet 0   pantoprazole  (PROTONIX ) 40 MG tablet Take 1 tablet (40 mg total) by mouth 2 (two) times daily. 60 tablet 2   FLUoxetine  (PROZAC ) 10 MG capsule Take 1 capsule (10 mg total) by mouth daily. (Patient not taking: Reported on 12/02/2023) 30 capsule 2   lisdexamfetamine (VYVANSE ) 40 MG capsule  Take 1 capsule (40 mg total) by mouth daily. (Patient not taking: Reported on 12/02/2023) 30 capsule 0   No current facility-administered medications for this visit.    Allergies as of 12/02/2023 - Review Complete 12/02/2023  Allergen Reaction Noted   Dust mite extract  12/13/2022    Family History  Problem Relation Age of Onset   Bipolar disorder Mother    Cancer Mother    Hypertension Paternal Grandmother    Diabetes Paternal Grandmother    Hyperlipidemia Paternal Grandmother    Heart disease Paternal Grandmother    Colon polyps Paternal Grandmother    Diabetes Other    Cancer Other    Colon cancer Neg Hx     Social History   Socioeconomic History   Marital status: Single    Spouse name: Not on file   Number of children: Not on file   Years of education: Not on file   Highest education level: Not on file  Occupational History   Not on file  Tobacco Use   Smoking status: Some Days    Types: E-cigarettes    Last attempt to quit: 05/2022    Years since quitting: 1.5   Smokeless tobacco: Never  Vaping Use   Vaping status: Some Days  Substance and Sexual Activity   Alcohol use: Yes    Comment: occ   Drug use: Not Currently    Types: Marijuana    Comment: tried marijuana a few times as a teenager; hit vape pen with THC in 2024 by accident   Sexual activity: Yes    Birth control/protection: I.U.D.  Other Topics Concern   Not on file  Social History Narrative   ** Merged History Encounter **       Lives with her grandmother. RCC in spring 2021.    Social Drivers of Corporate investment banker Strain: Low Risk  (04/28/2023)   Overall Financial Resource Strain (CARDIA)    Difficulty of Paying Living Expenses: Not very hard  Food Insecurity: No Food Insecurity (04/28/2023)   Hunger Vital Sign    Worried About Running Out of Food in the Last Year: Never true    Ran Out of Food in the Last Year: Never true  Transportation Needs: No Transportation Needs  (04/28/2023)   PRAPARE - Administrator, Civil Service (Medical): No    Lack of Transportation (Non-Medical): No  Physical Activity: Insufficiently Active (04/28/2023)   Exercise Vital Sign    Days of Exercise per Week: 1 day    Minutes of Exercise per Session: 10 min  Stress: No Stress Concern Present (04/28/2023)   [employer] of Occupational Health - Occupational Stress Questionnaire    Feeling of Stress : Not at all  Social Connections: Socially Isolated (04/28/2023)   Social Connection and Isolation Panel    Frequency of Communication with Friends and Family: Never  Frequency of Social Gatherings with Friends and Family: Never    Attends Religious Services: 1 to 4 times per year    Active Member of [employer] or Organizations: No    Attends Banker Meetings: Never    Marital Status: Never married  Intimate Partner Violence: Not At Risk (04/28/2023)   Humiliation, Afraid, Rape, and Kick questionnaire    Fear of Current or Ex-Partner: No    Emotionally Abused: No    Physically Abused: No    Sexually Abused: No     Review of Systems   Gen: Denies any fever, chills, fatigue, weight loss, lack of appetite.  CV: Denies chest pain, heart palpitations, peripheral edema, syncope.  Resp: Denies shortness of breath at rest or with exertion. Denies wheezing or cough.  GI: Denies dysphagia or odynophagia. Denies jaundice, hematemesis, fecal incontinence. GU : Denies urinary burning, urinary frequency, urinary hesitancy MS: Denies joint pain, muscle weakness, cramps, or limitation of movement.  Derm: Denies rash, itching, dry skin Psych: Denies depression, anxiety, memory loss, and confusion Heme: Denies bruising, bleeding, and enlarged lymph nodes.   Physical Exam   BP 114/79   Pulse 89   Temp 98.5 F (36.9 C)   Ht 5' 9.5 (1.765 m)   Wt 195 lb (88.5 kg)   BMI 28.38 kg/m  General:   Alert and oriented. Pleasant and cooperative. Well-nourished and  well-developed.  Head:  Normocephalic and atraumatic. Eyes:  Without icterus Abdomen:  +BS, soft, non-tender and non-distended. No HSM noted. No guarding or rebound. No masses appreciated.  Rectal:  Deferred  Msk:  Symmetrical without gross deformities. Normal posture. Extremities:  Without edema. Neurologic:  Alert and  oriented x4;  grossly normal neurologically. Skin:  Intact without significant lesions or rashes. Psych:  Alert and cooperative. Normal mood and affect.   Assessment   Dianca Durflinger is a 23 y.o. female presenting today with a history of    PLAN      Delman Ferns, PhD, ANP-BC [facility]

## 2023-12-02 NOTE — Patient Instructions (Signed)
 I am so glad you are doing better!  I have ordered iron studies and Vitamin D  to have drawn.   We will see you back in 6 months!!  I enjoyed seeing you again today! I value our relationship and want to provide genuine, compassionate, and quality care. You may receive a survey regarding your visit with me, and I welcome your feedback! Thanks so much for taking the time to complete this. I look forward to seeing you again.      Delman Ferns, PhD, ANP-BC [facility]

## 2023-12-04 ENCOUNTER — Telehealth (HOSPITAL_COMMUNITY): Admitting: Registered Nurse

## 2023-12-04 ENCOUNTER — Encounter (HOSPITAL_COMMUNITY): Payer: Self-pay | Admitting: Registered Nurse

## 2023-12-04 DIAGNOSIS — F50819 Binge eating disorder, unspecified: Secondary | ICD-10-CM | POA: Diagnosis not present

## 2023-12-04 DIAGNOSIS — F902 Attention-deficit hyperactivity disorder, combined type: Secondary | ICD-10-CM | POA: Diagnosis not present

## 2023-12-04 DIAGNOSIS — G4700 Insomnia, unspecified: Secondary | ICD-10-CM | POA: Diagnosis not present

## 2023-12-04 DIAGNOSIS — F411 Generalized anxiety disorder: Secondary | ICD-10-CM

## 2023-12-04 DIAGNOSIS — F410 Panic disorder [episodic paroxysmal anxiety] without agoraphobia: Secondary | ICD-10-CM

## 2023-12-04 MED ORDER — HYDROXYZINE HCL 10 MG PO TABS: ORAL_TABLET | ORAL | 1 refills | Status: DC

## 2023-12-04 MED ORDER — VENLAFAXINE HCL ER 37.5 MG PO CP24: 37.5000 mg | ORAL_CAPSULE | Freq: Every day | ORAL | 1 refills | Status: DC

## 2023-12-04 MED ORDER — LISDEXAMFETAMINE DIMESYLATE 40 MG PO CAPS
40.0000 mg | ORAL_CAPSULE | Freq: Every day | ORAL | 0 refills | Status: DC
Start: 2023-12-04 — End: 2023-12-25

## 2023-12-04 NOTE — Patient Instructions (Signed)

## 2023-12-04 NOTE — Progress Notes (Signed)
 [facility] MD/PA/NP OP Progress Note  12/04/2023 12:50 PM Tiffane Kiesling  MRN:  [id number]  Virtual Visit via Video Note  I connected with Rhodesia Schaberg on 12/04/23 at 11:30 AM EDT by a video enabled telemedicine application and verified that I am speaking with the correct person using two identifiers.  Location: Patient: Home Provider: Home office   I discussed the limitations of evaluation and management by telemedicine and the availability of in person appointments. The patient expressed understanding and agreed to proceed.    I discussed the assessment and treatment plan with the patient. The patient was provided an opportunity to ask questions and all were answered. The patient agreed with the plan and demonstrated an understanding of the instructions.   The patient was advised to call back or seek an in-person evaluation if the symptoms worsen or if the condition fails to improve as anticipated.  I provided 30 minutes of non-face-to-face time during this encounter.   Humberto Magnus, NP   Chief Complaint:  Chief Complaint  Patient presents with   Follow-up    Medication management   HPI: Ziona Lajara 23 y.o. female presents today for medication management follow up.  She is seen via virtual video visit by this provider, and chart reviewed on 12/04/23.  Her psychiatric history is significant for PTSD, ADHD, major depressive disorder, generalized anxiety disorder with panic attacks, and binge eating. Aaron Aas  Her mental health is currently managed with Prozac  20 mg daily, Vistaril  10-20 mg Tid prn, and Vyvanse  40 mg daily.  She reports she feels great but has not been taking her Prozac  for 3 weeks related to it making her depression worse.  Reports she has not taking her Vyvanse  in 1 month related to running out of refills and missed her last medication management visit.  She reports she has gained about 20 pounds in the last 2 months related to eating junk food.  However she denies binge eating.   She also states she has been going to the gym 2-3 times a week working out trying to lose that game on muscle.  but I would go to the gym and workout and then come home and eat junk food.  She reports she has also been working double shifts at work reports she is sleeping without any difficulty.  Today she denies suicidal/self-harm/homicidal ideation, psychosis, paranoia, and abnormal movements.  Recommended the following: Continue Vyvanse  40 mg daily, and Vistaril  10-20 mg 3 times daily as needed.  Discontinue Prozac  10 mg daily.  Start Effexor XR 37.5 mg daily.  Follow-up in 2 weeks.  She voices understanding with information being given to her today and is agreeable to recommendations.    Visit Diagnosis:    ICD-10-CM   1. Generalized anxiety disorder with panic attacks  F41.1 venlafaxine XR (EFFEXOR-XR) 37.5 MG 24 hr capsule   F41.0 hydrOXYzine  (ATARAX ) 10 MG tablet    2. Insomnia, unspecified type  G47.00 hydrOXYzine  (ATARAX ) 10 MG tablet    3. Binge eating disorder, unspecified severity  F50.819 lisdexamfetamine (VYVANSE ) 40 MG capsule    4. Attention deficit hyperactivity disorder (ADHD), combined type  F90.2 venlafaxine XR (EFFEXOR-XR) 37.5 MG 24 hr capsule    lisdexamfetamine (VYVANSE ) 40 MG capsule      Past Psychiatric History: PTSD, ADHD, major depressive disorder, generalized anxiety disorder with panic attacks, and binge eating   Past Medical History:  Past Medical History:  Diagnosis Date   Acute bronchitis 01/20/2013   Anxiety state 08/22/2015  Depression    Encounter for IUD insertion 01/27/2018   Hair loss 03/11/2022   Iron deficiency 05/15/2022   Migraines    Pain and swelling of toe of left foot 02/11/2022   Preventative health care 02/11/2022   Screening examination for STD (sexually transmitted disease) 03/20/2021   Trichimoniasis    Vaginal burning 03/20/2021   Vaginal discharge 03/20/2021   Vaginal irritation 06/04/2021    Past Surgical History:   Procedure Laterality Date   BIOPSY  05/07/2019   Procedure: BIOPSY;  Surgeon: Alyce Jubilee, MD;  [facility];  Service: Endoscopy;;   BIOPSY  08/23/2022   Procedure: BIOPSY;  Surgeon: Vinetta Greening, DO;  [facility];  Service: Endoscopy;;   COLONOSCOPY     ESOPHAGOGASTRODUODENOSCOPY N/A 05/07/2019   Procedure: ESOPHAGOGASTRODUODENOSCOPY (EGD);  Surgeon: Alyce Jubilee, MD;  [facility];  Service: Endoscopy;  Laterality: N/A;  3:00pm   ESOPHAGOGASTRODUODENOSCOPY (EGD) WITH PROPOFOL  N/A 08/23/2022   Procedure: ESOPHAGOGASTRODUODENOSCOPY (EGD) WITH PROPOFOL ;  Surgeon: Vinetta Greening, DO;  [facility];  Service: Endoscopy;  Laterality: N/A;  1:15 pm ,asa 2   TONSILLECTOMY      Family Psychiatric History: See below in family history  Family History:  Family History  Problem Relation Age of Onset   Bipolar disorder Mother    Cancer Mother    Hypertension Paternal Grandmother    Diabetes Paternal Grandmother    Hyperlipidemia Paternal Grandmother    Heart disease Paternal Grandmother    Colon polyps Paternal Grandmother    Diabetes Other    Cancer Other    Colon cancer Neg Hx     Social History:  Social History   Socioeconomic History   Marital status: Single    Spouse name: Not on file   Number of children: Not on file   Years of education: Not on file   Highest education level: Not on file  Occupational History   Not on file  Tobacco Use   Smoking status: Some Days    Types: E-cigarettes    Last attempt to quit: 05/2022    Years since quitting: 1.5   Smokeless tobacco: Never  Vaping Use   Vaping status: Some Days  Substance and Sexual Activity   Alcohol use: Yes    Comment: occ   Drug use: Not Currently    Types: Marijuana    Comment: tried marijuana a few times as a teenager; hit vape pen with THC in 2024 by accident   Sexual activity: Yes    Birth control/protection: I.U.D.  Other Topics Concern   Not on  file  Social History Narrative   ** Merged History Encounter **       Lives with her grandmother. [facility] in spring 2021.    Social Drivers of Corporate investment banker Strain: Low Risk  (04/28/2023)   Overall Financial Resource Strain (CARDIA)    Difficulty of Paying Living Expenses: Not very hard  Food Insecurity: No Food Insecurity (04/28/2023)   Hunger Vital Sign    Worried About Running Out of Food in the Last Year: Never true    Ran Out of Food in the Last Year: Never true  Transportation Needs: No Transportation Needs (04/28/2023)   PRAPARE - Administrator, Civil Service (Medical): No    Lack of Transportation (Non-Medical): No  Physical Activity: Insufficiently Active (04/28/2023)   Exercise Vital Sign    Days of Exercise per Week: 1  day    Minutes of Exercise per Session: 10 min  Stress: No Stress Concern Present (04/28/2023)   [employer] of Occupational Health - Occupational Stress Questionnaire    Feeling of Stress : Not at all  Social Connections: Socially Isolated (04/28/2023)   Social Connection and Isolation Panel    Frequency of Communication with Friends and Family: Never    Frequency of Social Gatherings with Friends and Family: Never    Attends Religious Services: 1 to 4 times per year    Active Member of [employer] or Organizations: No    Attends Banker Meetings: Never    Marital Status: Never married    Allergies:  Allergies  Allergen Reactions   Dust Mite Extract     Metabolic Disorder Labs: Lab Results  Component Value Date   HGBA1C 5.1 09/29/2023   MPG 100 09/29/2023   Lab Results  Component Value Date   PROLACTIN 10.0 09/29/2023   Lab Results  Component Value Date   CHOL 157 09/29/2023   TRIG 41 09/29/2023   HDL 77 09/29/2023   CHOLHDL 2.0 09/29/2023   LDLCALC 68 09/29/2023   LDLCALC 58 10/17/2022   Lab Results  Component Value Date   TSH 0.79 09/29/2023   TSH 0.953 10/17/2022    Current  Medications: Current Outpatient Medications  Medication Sig Dispense Refill   venlafaxine XR (EFFEXOR-XR) 37.5 MG 24 hr capsule Take 1 capsule (37.5 mg total) by mouth daily with breakfast. 30 capsule 1   amitriptyline  (ELAVIL ) 25 MG tablet Take 25 mg by mouth at bedtime.     dicyclomine  (BENTYL ) 10 MG capsule Take 1 capsule (10 mg total) by mouth 4 (four) times daily -  before meals and at bedtime. As needed for cramping 120 capsule 1   hydrOXYzine  (ATARAX ) 10 MG tablet Take 10 - 20 mg (1 to 2 tablets) three times a day as needed for anxiety/sleep 60 tablet 1   levonorgestrel  (LILETTA ) 19.5 MCG/DAY IUD IUD 1 each by Intrauterine route once.     lisdexamfetamine (VYVANSE ) 40 MG capsule Take 1 capsule (40 mg total) by mouth daily. 30 capsule 0   ondansetron  (ZOFRAN ) 4 MG tablet Take 1 tablet (4 mg total) by mouth every 8 (eight) hours as needed for nausea or vomiting. 60 tablet 2   pantoprazole  (PROTONIX ) 40 MG tablet Take 1 tablet (40 mg total) by mouth 2 (two) times daily. 180 tablet 3   No current facility-administered medications for this visit.     Musculoskeletal: Strength & Muscle Tone: Unable to assess via virtual visit Gait & Station: Unable to assess via virtual visit Patient leans: N/A  Psychiatric Specialty Exam: Review of Systems  Constitutional:        No other complaints voiced  Psychiatric/Behavioral:  Positive for dysphoric mood. Negative for agitation, decreased concentration (Improved), hallucinations, sleep disturbance and suicidal ideas. The patient is nervous/anxious.   All other systems reviewed and are negative.   There were no vitals taken for this visit.There is no height or weight on file to calculate BMI.  General Appearance: Casual  Eye Contact:  Good  Speech:  Clear and Coherent and Normal Rate  Volume:  Normal  Mood:  Euthymic  Affect:  Congruent  Thought Process:  Coherent, Goal Directed, and Descriptions of Associations: Intact  Orientation:  Full  (Time, Place, and Person)  Thought Content: Logical   Suicidal Thoughts:  No  Homicidal Thoughts:  No  Memory:  Immediate;   Good Recent;  Good Remote;   Good  Judgement:  Intact  Insight:  Present  Psychomotor Activity:  Normal  Concentration:  Concentration: Good and Attention Span: Good  Recall:  Good  Fund of Knowledge: Good  Language: Good  Akathisia:  No  Handed:  Right  AIMS (if indicated): not done  Assets:  Communication Skills Desire for Improvement Housing Leisure Time Physical Health Resilience Social Support Transportation  ADL's:  Intact  Cognition: WNL  Sleep:  Good   Screenings: AIMS    Flowsheet Row Video Visit from 12/04/2023 in [facility] [place]  AIMS Total Score 0   GAD-7    Flowsheet Row Video Visit from 12/04/2023 in [facility] [place] Office Visit from 09/25/2023 in [facility] [place] Office Visit from 04/28/2023 in [facility] Office Visit from 01/17/2023 in [facility] Office Visit from 10/17/2022 in [facility]  Total GAD-7 Score 0 19 0 15 16   PHQ2-9    Flowsheet Row Video Visit from 12/04/2023 in [facility] [place] Office Visit from 09/25/2023 in [facility] [place] Office Visit from 08/01/2023 in [facility] Office Visit from 04/28/2023 in [facility] Office Visit from 01/17/2023 in [facility]  PHQ-2 Total Score 0 2 0 0 0  PHQ-9 Total Score -- 11 -- 0 0   Flowsheet Row Video Visit from 12/04/2023 in [facility] [place] Office Visit from 09/25/2023 in [facility] [place] Admission (Discharged) from 08/23/2022 in [place]  ENDOSCOPY  C-SSRS RISK CATEGORY No Risk No Risk No Risk     Assessment and Plan: Assessment: Patient seen and examined as noted above. Summary: Today Malene Driggs appears to be doing well.  Reports stopped taking Prozac  3 weeks ago related to making depression worse.  Ran out of refills for Vyvanse  1 month ago.  Reporting weight gain related to eating junk food however she does deny binge eating.  Reports she is sleeping without any difficulty.  Today she denies suicidal/self-harm/homicidal ideations, psychosis, paranoia, and abnormal movements. During visit she is dressed appropriate for age and weather.  She is seated comfortably in view of camera with no noted distress.  She is alert/oriented x 4, calm/cooperative and mood is congruent with affect.  She spoke in a clear tone at moderate volume, and normal pace, with good eye contact.  Her thought process is coherent, relevant, and there is no indication that she is currently responding to internal/external stimuli or experiencing delusional thought content.    1. Generalized anxiety disorder with panic attacks - venlafaxine XR (EFFEXOR-XR) 37.5 MG 24 hr capsule; Take 1 capsule (37.5 mg total) by mouth daily with breakfast.  Dispense: 30 capsule; Refill: 1 - hydrOXYzine  (ATARAX ) 10 MG tablet; Take 10 - 20 mg (1 to 2 tablets) three times a day as needed for anxiety/sleep  Dispense: 60 tablet; Refill: 1  2. Insomnia, unspecified type - hydrOXYzine  (ATARAX ) 10 MG tablet; Take 10 - 20 mg (1 to 2 tablets) three times a day as needed for anxiety/sleep  Dispense: 60 tablet; Refill: 1  3. Binge eating disorder, unspecified severity - lisdexamfetamine (VYVANSE ) 40 MG capsule; Take 1 capsule (40 mg total) by mouth daily.  Dispense: 30 capsule; Refill: 0  4. Attention deficit hyperactivity disorder (ADHD), combined type - venlafaxine XR (EFFEXOR-XR) 37.5 MG 24 hr capsule; Take 1 capsule (37.5 mg total) by mouth daily with breakfast.  Dispense: 30  capsule; Refill: 1 - lisdexamfetamine (VYVANSE ) 40 MG capsule; Take 1 capsule (40 mg total) by mouth daily.  Dispense: 30 capsule; Refill: 0    Plan: Medications: Meds ordered this encounter  Medications   venlafaxine XR (EFFEXOR-XR) 37.5 MG 24 hr capsule    Sig: Take 1 capsule (37.5 mg total) by mouth daily with breakfast.    Dispense:  30 capsule    Refill:  1    Supervising Provider:   Eduard Grad T [[id number]]   hydrOXYzine  (ATARAX ) 10 MG tablet    Sig: Take 10 - 20 mg (1 to 2 tablets) three times a day as needed for anxiety/sleep    Dispense:  60 tablet    Refill:  1    Supervising Provider:   Eduard Grad T [[id number]]   lisdexamfetamine (VYVANSE ) 40 MG capsule    Sig: Take 1 capsule (40 mg total) by mouth daily.    Dispense:  30 capsule    Refill:  0    Supervising Provider:   Eduard Grad T [[id number]]   Labs: Not indicated at this time  Other:  Continue counseling/therapy.   Tanzania Hickok is instructed to call 911, [phone], mobile crisis, or present to the nearest emergency room should she experience any suicidal/homicidal ideation, auditory/visual/hallucinations, or detrimental worsening of her mental health condition.  Brogan Plascencia  participated in the development of this treatment plan and verbalized her agreement with plan as listed.  Follow Up: Return in 2 weeks Call in the interim for any side-effects, decompensation, questions, or problems  Collaboration of Care: Collaboration of Care: Medication Management AEB Medication adjustment and refill and Other UDS ordered  Patient/Guardian was advised Release of Information must be obtained prior to any record release in order to collaborate their care with an outside provider. Patient/Guardian was advised if they have not already done so to contact the registration department to sign all necessary forms in order for us  to release information regarding their care.   Consent: Patient/Guardian gives verbal consent for treatment and  assignment of benefits for services provided during this visit. Patient/Guardian expressed understanding and agreed to proceed.    Hansel Devan, NP 12/04/2023, 12:50 PM

## 2023-12-09 ENCOUNTER — Telehealth: Payer: Self-pay

## 2023-12-09 NOTE — Telephone Encounter (Signed)
 Returned the pt's call LMOVM for the pt to call me back regarding labs

## 2023-12-16 ENCOUNTER — Telehealth (HOSPITAL_COMMUNITY): Payer: Self-pay | Admitting: *Deleted

## 2023-12-16 NOTE — Telephone Encounter (Signed)
 Patient called and lm stating she would like f/u appt with provider. Staff called patient back and was not able to reach patient and lmom.

## 2023-12-18 NOTE — Telephone Encounter (Signed)
 Patient aware.

## 2023-12-23 ENCOUNTER — Ambulatory Visit (INDEPENDENT_AMBULATORY_CARE_PROVIDER_SITE_OTHER): Admitting: Clinical

## 2023-12-23 DIAGNOSIS — F909 Attention-deficit hyperactivity disorder, unspecified type: Secondary | ICD-10-CM | POA: Diagnosis not present

## 2023-12-23 DIAGNOSIS — F902 Attention-deficit hyperactivity disorder, combined type: Secondary | ICD-10-CM

## 2023-12-23 DIAGNOSIS — F4310 Post-traumatic stress disorder, unspecified: Secondary | ICD-10-CM | POA: Diagnosis not present

## 2023-12-23 DIAGNOSIS — F410 Panic disorder [episodic paroxysmal anxiety] without agoraphobia: Secondary | ICD-10-CM

## 2023-12-23 DIAGNOSIS — F411 Generalized anxiety disorder: Secondary | ICD-10-CM | POA: Diagnosis not present

## 2023-12-23 DIAGNOSIS — F32A Depression, unspecified: Secondary | ICD-10-CM

## 2023-12-23 DIAGNOSIS — F331 Major depressive disorder, recurrent, moderate: Secondary | ICD-10-CM

## 2023-12-23 NOTE — Progress Notes (Signed)
 Virtual Visit via Video Note   I connected with Patty Gomez on 12/23/23 at 10:00 AM EDT by a video enabled telemedicine application and verified that I am speaking with the correct person using two identifiers.   Location: Patient: home Provider: office   I discussed the limitations of evaluation and management by telemedicine and the availability of in person appointments. The patient expressed understanding and agreed to proceed.     THERAPY PROGRESS NOTE   Session Time: 10:00 AM- 10:30 AM   Participation Level: Active   Behavioral Response: CasualAlert/Hyperverbal   Type of Therapy: Individual Therapy   Treatment Goals addressed: Coping   Interventions: CBT, DBT, Solution Focused, Strength-based and Supportive   Summary: Patty Gomez is a 23y.o. female who presents with Depression, Anxiety, PTSD, ADHD . The OPT therapist worked with the patient for her OPT treatment session. The OPT therapist utilized Motivational Interviewing to assist in creating therapeutic repore. The patient in the session was engaged and work in collaboration giving feedback about her triggers and symptoms over the past few weeks. The patient noted she has continued to check in with her probation office on a weekly basis. The patient spoke about serving  weekends of her sentence during the past few weekends the patient notes she has 1 weekend remaining of her weekends in jail sentence to complete.The patient spoke about  transition from Dr. Barbra to [name] for her med management/psychiatry with a upcoming appointment 12/25/2023. The patient spoke about positive changes since her last session including losing her job at a [employer] due to it creating to much Anxiety, things being rocky in her relationship, but  positive of getting closer to finishing her weekends in [place] with just 1 weekend left to serve to complete her court required time.    Suicidal/Homicidal: Nowithout intent/plan    Therapist Response:The OPT therapist worked with the patient for the patients scheduled session. The patient was engaged in her session and gave feedback in relation to triggers, symptoms, and behavior responses over the past few weeks. The patient spoke about her ongoing experience though her Jail term during the weekends and is now down to 2 weekend to serve to finish her sentence.  The OPT therapist worked with the patient utilizing an in session Cognitive Behavioral Therapy exercise. The patient was responsive in the session and verbalized,  I am doing good I only have 1 weekend left and I will be completely done!. The OPT therapist worked with the patient overviewing basic health areas including sleep cycle, eating habits, hygiene, and physical exercise. The patient in this session verbalized no current S/I or H/I. The OPT therapist worked with the patient on planning and working on her goals post finishing her legal requirements.  The patient spoke about her work with psychiatrist [name] with a upcoming appointment 12/25/23. . The patient spoke about looking forward to going to [place] this week. The patient spoke about her work to get out more often and use coping skills and get out of the home more.The patient spoke about her daily affirmations and this helping her to have more positive thinking. The patient spoke about eating better. The patient spoke about her faith being a large protective factor and coping skills for her in her bible study. The OPT therapist worked with the patient promoting her compliance with med management. The patient spoke about getting new glasses. The patient spoke about her realization of her strength as a person and a inspiration to others.The  patient spoke about her The OPT therapist will continue treatment work with the patient in her next scheduled session.   Plan: Return again in 2/3 weeks.   Diagnosis:      Axis I:Depression/ GAD/PTSD/ADHD                            Axis II: No diagnosis       Collaboration of Care:  Overview of the patient involvement in the Med Management   Patient/Guardian was advised Release of Information must be obtained prior to any record release in order to collaborate their care with an outside provider. Patient/Guardian was advised if they have not already done so to contact the registration department to sign all necessary forms in order for us  to release information regarding their care.    Consent: Patient/Guardian gives verbal consent for treatment and assignment of benefits for services provided during this visit. Patient/Guardian expressed understanding and agreed to proceed.    I discussed the assessment and treatment plan with the patient. The patient was provided an opportunity to ask questions and all were answered. The patient agreed with the plan and demonstrated an understanding of the instructions.   The patient was advised to call back or seek an in-person evaluation if the symptoms worsen or if the condition fails to improve as anticipated.   I provided 30 minutes of non-face-to-face time during this encounter.   Jerel Pepper, LCSW   12/23/2023

## 2023-12-25 ENCOUNTER — Telehealth (HOSPITAL_COMMUNITY): Admitting: Registered Nurse

## 2023-12-25 ENCOUNTER — Encounter (HOSPITAL_COMMUNITY): Payer: Self-pay | Admitting: Registered Nurse

## 2023-12-25 ENCOUNTER — Ambulatory Visit: Payer: Self-pay

## 2023-12-25 ENCOUNTER — Telehealth: Payer: Self-pay

## 2023-12-25 DIAGNOSIS — F50819 Binge eating disorder, unspecified: Secondary | ICD-10-CM

## 2023-12-25 DIAGNOSIS — F411 Generalized anxiety disorder: Secondary | ICD-10-CM | POA: Diagnosis not present

## 2023-12-25 DIAGNOSIS — G4700 Insomnia, unspecified: Secondary | ICD-10-CM

## 2023-12-25 DIAGNOSIS — F902 Attention-deficit hyperactivity disorder, combined type: Secondary | ICD-10-CM

## 2023-12-25 DIAGNOSIS — F410 Panic disorder [episodic paroxysmal anxiety] without agoraphobia: Secondary | ICD-10-CM | POA: Diagnosis not present

## 2023-12-25 MED ORDER — VENLAFAXINE HCL ER 75 MG PO CP24: 75.0000 mg | ORAL_CAPSULE | Freq: Every day | ORAL | 0 refills | Status: DC

## 2023-12-25 MED ORDER — LISDEXAMFETAMINE DIMESYLATE 40 MG PO CAPS: 40.0000 mg | ORAL_CAPSULE | Freq: Every day | ORAL | 0 refills | Status: DC

## 2023-12-25 MED ORDER — HYDROXYZINE HCL 10 MG PO TABS: ORAL_TABLET | ORAL | 1 refills | Status: DC

## 2023-12-25 NOTE — Progress Notes (Signed)
 [facility] MD/PA/NP OP Progress Note  12/25/2023 1:18 PM Patty Gomez  MRN:  [id number]  Virtual Visit via Video Note  I connected with Patty Gomez on 12/25/23 at  9:30 AM EDT by a video enabled telemedicine application and verified that I am speaking with the correct person using two identifiers.  Location: Patient: Home Provider: Home office   I discussed the limitations of evaluation and management by telemedicine and the availability of in person appointments. The patient expressed understanding and agreed to proceed.    I discussed the assessment and treatment plan with the patient. The patient was provided an opportunity to ask questions and all were answered. The patient agreed with the plan and demonstrated an understanding of the instructions.   The patient was advised to call back or seek an in-person evaluation if the symptoms worsen or if the condition fails to improve as anticipated.  I provided 30 minutes of non-face-to-face time during this encounter.   Luisa Ruder, NP   Chief Complaint:  Chief Complaint  Patient presents with   Follow-up    Medication management   HPI: Patty Gomez 23 y.o. female presents today for medication management follow up.  She is seen via virtual video visit by this provider, and chart reviewed on 12/25/23.  Her psychiatric history is significant for PTSD, ADHD, major depressive disorder, generalized anxiety disorder with panic attacks, and binge eating.  Her mental health is currently managed with Effexor  XR 37.5 mg daily, Vyvanse  40 mg daily, and Vistaril  10-20 mg Tid prn.  She reports she feels that her medication is working and that she feels great but she is having more episodes of palpitations and chest pain.  She states that the chest pain is worse after working out.  I've been taking more of the Vistaril .  I guess it helps.  It makes me sleepy and I calm down.  Discussed other options that could be the cause of chest pain.  Referred to  PCP for further testing.  She states this is her last weekend of jail time.  She reports eating and sleeping without difficulty. Today she denies suicidal/self-harm/homicidal ideation, psychosis, paranoia, and abnormal movements.      Treatment options discussed:  Increase Effexor  to get a better grip on anxiety.  Referral to PCP for further testing related to heart pain.    Recommended the following: Continue Vyvanse  40 mg daily, Vistaril  10-20 mg 3 times daily as needed.  Increase Effexor  XR 75 mg daily.  Follow-up 1 month, Continue counseling with Jerel Pepper, LCSW.  She voices understanding with information being given to her today and is agreeable to recommendations.    Visit Diagnosis:    ICD-10-CM   1. Binge eating disorder, unspecified severity  F50.819 lisdexamfetamine (VYVANSE ) 40 MG capsule    2. Attention deficit hyperactivity disorder (ADHD), combined type  F90.2 lisdexamfetamine (VYVANSE ) 40 MG capsule    venlafaxine  XR (EFFEXOR -XR) 75 MG 24 hr capsule    3. Generalized anxiety disorder with panic attacks  F41.1 venlafaxine  XR (EFFEXOR -XR) 75 MG 24 hr capsule   F41.0 hydrOXYzine  (ATARAX ) 10 MG tablet    4. Insomnia, unspecified type  G47.00 hydrOXYzine  (ATARAX ) 10 MG tablet      Past Psychiatric History: PTSD, ADHD, major depressive disorder, generalized anxiety disorder with panic attacks, and binge eating   Past Medical History:  Past Medical History:  Diagnosis Date   Acute bronchitis 01/20/2013   Anxiety state 08/22/2015   Depression    Encounter for  IUD insertion 01/27/2018   Hair loss 03/11/2022   Iron deficiency 05/15/2022   Migraines    Pain and swelling of toe of left foot 02/11/2022   Preventative health care 02/11/2022   Screening examination for STD (sexually transmitted disease) 03/20/2021   Trichimoniasis    Vaginal burning 03/20/2021   Vaginal discharge 03/20/2021   Vaginal irritation 06/04/2021    Past Surgical History:  Procedure Laterality  Date   BIOPSY  05/07/2019   Procedure: BIOPSY;  Surgeon: Harvey Margo CROME, MD;  [facility];  Service: Endoscopy;;   BIOPSY  08/23/2022   Procedure: BIOPSY;  Surgeon: Cindie Carlin POUR, DO;  [facility];  Service: Endoscopy;;   COLONOSCOPY     ESOPHAGOGASTRODUODENOSCOPY N/A 05/07/2019   Procedure: ESOPHAGOGASTRODUODENOSCOPY (EGD);  Surgeon: Harvey Margo CROME, MD;  [facility];  Service: Endoscopy;  Laterality: N/A;  3:00pm   ESOPHAGOGASTRODUODENOSCOPY (EGD) WITH PROPOFOL  N/A 08/23/2022   Procedure: ESOPHAGOGASTRODUODENOSCOPY (EGD) WITH PROPOFOL ;  Surgeon: Cindie Carlin POUR, DO;  [facility];  Service: Endoscopy;  Laterality: N/A;  1:15 pm ,asa 2   TONSILLECTOMY      Family Psychiatric History: See below in family history  Family History:  Family History  Problem Relation Age of Onset   Bipolar disorder Mother    Cancer Mother    Hypertension Paternal Grandmother    Diabetes Paternal Grandmother    Hyperlipidemia Paternal Grandmother    Heart disease Paternal Grandmother    Colon polyps Paternal Grandmother    Diabetes Other    Cancer Other    Colon cancer Neg Hx     Social History:  Social History   Socioeconomic History   Marital status: Single    Spouse name: Not on file   Number of children: Not on file   Years of education: Not on file   Highest education level: Not on file  Occupational History   Not on file  Tobacco Use   Smoking status: Some Days    Types: E-cigarettes    Last attempt to quit: 05/2022    Years since quitting: 1.6   Smokeless tobacco: Never  Vaping Use   Vaping status: Some Days  Substance and Sexual Activity   Alcohol use: Yes    Comment: occ   Drug use: Not Currently    Types: Marijuana    Comment: tried marijuana a few times as a teenager; hit vape pen with THC in 2024 by accident   Sexual activity: Yes    Birth control/protection: I.U.D.  Other Topics Concern   Not on file  Social History  Narrative   ** Merged History Encounter **       Lives with her grandmother. RCC in spring 2021.    Social Drivers of Corporate investment banker Strain: Low Risk  (04/28/2023)   Overall Financial Resource Strain (CARDIA)    Difficulty of Paying Living Expenses: Not very hard  Food Insecurity: No Food Insecurity (04/28/2023)   Hunger Vital Sign    Worried About Running Out of Food in the Last Year: Never true    Ran Out of Food in the Last Year: Never true  Transportation Needs: No Transportation Needs (04/28/2023)   PRAPARE - Administrator, Civil Service (Medical): No    Lack of Transportation (Non-Medical): No  Physical Activity: Insufficiently Active (04/28/2023)   Exercise Vital Sign    Days of Exercise per Week: 1 day    Minutes of  Exercise per Session: 10 min  Stress: No Stress Concern Present (04/28/2023)   [employer] of Occupational Health - Occupational Stress Questionnaire    Feeling of Stress : Not at all  Social Connections: Socially Isolated (04/28/2023)   Social Connection and Isolation Panel    Frequency of Communication with Friends and Family: Never    Frequency of Social Gatherings with Friends and Family: Never    Attends Religious Services: 1 to 4 times per year    Active Member of [employer] or Organizations: No    Attends Banker Meetings: Never    Marital Status: Never married    Allergies:  Allergies  Allergen Reactions   Dust Mite Extract     Metabolic Disorder Labs: Lab Results  Component Value Date   HGBA1C 5.1 09/29/2023   MPG 100 09/29/2023   Lab Results  Component Value Date   PROLACTIN 10.0 09/29/2023   Lab Results  Component Value Date   CHOL 157 09/29/2023   TRIG 41 09/29/2023   HDL 77 09/29/2023   CHOLHDL 2.0 09/29/2023   LDLCALC 68 09/29/2023   LDLCALC 58 10/17/2022   Lab Results  Component Value Date   TSH 0.79 09/29/2023   TSH 0.953 10/17/2022    Current Medications: Current Outpatient  Medications  Medication Sig Dispense Refill   amitriptyline  (ELAVIL ) 25 MG tablet Take 25 mg by mouth at bedtime.     dicyclomine  (BENTYL ) 10 MG capsule Take 1 capsule (10 mg total) by mouth 4 (four) times daily -  before meals and at bedtime. As needed for cramping 120 capsule 1   hydrOXYzine  (ATARAX ) 10 MG tablet Take 10 - 20 mg (1 to 2 tablets) three times a day as needed for anxiety/sleep 60 tablet 1   levonorgestrel  (LILETTA ) 19.5 MCG/DAY IUD IUD 1 each by Intrauterine route once.     lisdexamfetamine (VYVANSE ) 40 MG capsule Take 1 capsule (40 mg total) by mouth daily. 30 capsule 0   ondansetron  (ZOFRAN ) 4 MG tablet Take 1 tablet (4 mg total) by mouth every 8 (eight) hours as needed for nausea or vomiting. 60 tablet 2   pantoprazole  (PROTONIX ) 40 MG tablet Take 1 tablet (40 mg total) by mouth 2 (two) times daily. 180 tablet 3   venlafaxine  XR (EFFEXOR -XR) 75 MG 24 hr capsule Take 1 capsule (75 mg total) by mouth daily with breakfast. 30 capsule 0   No current facility-administered medications for this visit.     Musculoskeletal: Strength & Muscle Tone: Unable to assess via virtual visit Gait & Station: Unable to assess via virtual visit Patient leans: N/A  Psychiatric Specialty Exam: Review of Systems  Constitutional:        No other complaints voiced  Respiratory:  Negative for apnea and shortness of breath.   Cardiovascular:  Chest pain: Reports having chest pain after working out. Palpitations: Denies at this time but reports episodes of increased incidents of palpitations and unsure if it is related to anxiety or heart issues.       Referral to PCP for further assessment to rule out any other issues  Psychiatric/Behavioral:  Positive for dysphoric mood (Stable). Negative for agitation, decreased concentration (Improved), hallucinations, sleep disturbance and suicidal ideas. The patient is nervous/anxious.   All other systems reviewed and are negative.   There were no vitals  taken for this visit.There is no height or weight on file to calculate BMI.  General Appearance: Casual  Eye Contact:  Good  Speech:  Clear  and Coherent and Normal Rate  Volume:  Normal  Mood:  Euthymic  Affect:  Congruent  Thought Process:  Coherent, Goal Directed, and Descriptions of Associations: Intact  Orientation:  Full (Time, Place, and Person)  Thought Content: Logical   Suicidal Thoughts:  No  Homicidal Thoughts:  No  Memory:  Immediate;   Good Recent;   Good Remote;   Good  Judgement:  Intact  Insight:  Present  Psychomotor Activity:  Normal  Concentration:  Concentration: Good and Attention Span: Good  Recall:  Good  Fund of Knowledge: Good  Language: Good  Akathisia:  No  Handed:  Right  AIMS (if indicated): not done  Assets:  Communication Skills Desire for Improvement Housing Leisure Time Physical Health Resilience Social Support Transportation  ADL's:  Intact  Cognition: WNL  Sleep:  Good   Screenings: AIMS    Flowsheet Row Video Visit from 12/04/2023 in [facility] [place]  AIMS Total Score 0   GAD-7    Flowsheet Row Video Visit from 12/04/2023 in [facility] [place] Office Visit from 09/25/2023 in [facility] [place] Office Visit from 04/28/2023 in [facility] Office Visit from 01/17/2023 in [facility] Office Visit from 10/17/2022 in [facility]  Total GAD-7 Score 0 19 0 15 16   PHQ2-9    Flowsheet Row Video Visit from 12/04/2023 in [facility] [place] Office Visit from 09/25/2023 in [facility] [place] Office Visit from 08/01/2023 in [facility] Office Visit from 04/28/2023 in [facility] Office Visit from 01/17/2023 in [facility]  PHQ-2 Total Score 0 2 0 0 0  PHQ-9 Total Score -- 11 -- 0 0   Flowsheet Row Video Visit from 12/04/2023 in [facility] [place] Office Visit from 09/25/2023 in [facility] [place] Admission (Discharged) from 08/23/2022 in [facility]  C-SSRS RISK CATEGORY No Risk No Risk No Risk   Assessment and Plan: Assessment: Patient seen and examined as noted above. Summary: Today Patty Gomez appears to be doing well.  Reports she feels great and feels that medications are work but there have been increased episodes of palpitations and chest pain.  Chest pain is worse after working out.  Referred to PCP for further testing to rule out other problems that could be causing chest pain.  She denies suicidal/self-harm/homicidal ideations, psychosis, paranoia, and abnormal movements. During visit she is dressed appropriate for age and weather.  She is seated comfortably in view of camera with no noted distress.  She is alert/oriented x 4, calm/cooperative and mood is congruent with affect.  She spoke in a clear tone at moderate volume, and normal pace, with good eye contact.  Her thought process is coherent, relevant, and there is no indication that she is currently responding to internal/external stimuli or experiencing delusional thought content.    1. Binge eating disorder, unspecified severity - lisdexamfetamine (VYVANSE ) 40 MG capsule; Take 1 capsule (40 mg total) by mouth daily.  Dispense: 30 capsule; Refill: 0  2. Attention deficit hyperactivity disorder (ADHD), combined type - lisdexamfetamine (VYVANSE ) 40 MG capsule; Take 1 capsule (40 mg total) by mouth daily.  Dispense: 30 capsule; Refill: 0 - venlafaxine  XR (EFFEXOR -XR) 75 MG 24  hr capsule; Take 1 capsule (75 mg total) by mouth daily with breakfast.  Dispense: 30 capsule; Refill: 0  3. Generalized anxiety disorder with panic attacks - venlafaxine   XR (EFFEXOR -XR) 75 MG 24 hr capsule; Take 1 capsule (75 mg total) by mouth daily with breakfast.  Dispense: 30 capsule; Refill: 0 - hydrOXYzine  (ATARAX ) 10 MG tablet; Take 10 - 20 mg (1 to 2 tablets) three times a day as needed for anxiety/sleep  Dispense: 60 tablet; Refill: 1  4. Insomnia, unspecified type - hydrOXYzine  (ATARAX ) 10 MG tablet; Take 10 - 20 mg (1 to 2 tablets) three times a day as needed for anxiety/sleep  Dispense: 60 tablet; Refill: 1   Plan: Medications: Meds ordered this encounter  Medications   lisdexamfetamine (VYVANSE ) 40 MG capsule    Sig: Take 1 capsule (40 mg total) by mouth daily.    Dispense:  30 capsule    Refill:  0    Supervising Provider:   CURRY PATERSON T [[id number]]   venlafaxine  XR (EFFEXOR -XR) 75 MG 24 hr capsule    Sig: Take 1 capsule (75 mg total) by mouth daily with breakfast.    Dispense:  30 capsule    Refill:  0    Supervising Provider:   CURRY PATERSON T [[id number]]   hydrOXYzine  (ATARAX ) 10 MG tablet    Sig: Take 10 - 20 mg (1 to 2 tablets) three times a day as needed for anxiety/sleep    Dispense:  60 tablet    Refill:  1    Supervising Provider:   CURRY PATERSON DASEN [[id number]]   Labs: Not indicated at this time  Other:  Continue counseling/therapy with Jerel Pepper, LCSW.   Patty Gomez is instructed to call [phone], mobile crisis, or present to the nearest emergency room should she experience any suicidal/homicidal ideation, auditory/visual/hallucinations, or detrimental worsening of her mental health condition.  Patty Gomez  participated in the development of this treatment plan and verbalized her agreement with plan as listed.  Follow Up: Return in 1 month for medication management Call in the interim for any side-effects, decompensation, questions, or problems  Collaboration of Care: Collaboration of Care: Medication Management AEB Medication assessment, adjustment, and refill and Primary Care Provider AEB Referred to PCP to assess cardiac issues.     Patient/Guardian was advised Release of Information must be obtained prior to any record release in order to collaborate their care with an outside provider. Patient/Guardian was advised if they have not already done so to contact the registration department to sign all necessary forms in order for us  to release information regarding their care.   Consent: Patient/Guardian gives verbal consent for treatment and assignment of benefits for services provided during this visit. Patient/Guardian expressed understanding and agreed to proceed.    Kayleb Warshaw, NP 12/25/2023, 1:18 PM

## 2023-12-25 NOTE — Telephone Encounter (Signed)
Appt scheduled. Patient aware. 

## 2023-12-25 NOTE — Patient Instructions (Signed)

## 2023-12-25 NOTE — Telephone Encounter (Signed)
 Copied from CRM [id number]. Topic: Clinical - Request for Lab/Test Order >> Dec 25, 2023 12:12 PM Antwanette L wrote: Reason for CRM: Patient needs to make an appt for blood work. Please contact the patient at [phone]

## 2023-12-25 NOTE — Telephone Encounter (Signed)
 FYI Only or Action Required?: Action required by provider: request for appointment.  Patient was last seen in primary care on 10/06/2023 by Melvenia Manus BRAVO, MD.  Called Nurse Triage reporting Chest Pain.  Symptoms began started 2 months ago.  Interventions attempted: Rest, hydration, or home remedies.  Symptoms are: unchanged.  Triage Disposition: See Physician Within 24 Hours-needs a phone call for follow up.   Patient/caregiver understands and will follow disposition?: No, wishes to speak with PCP  Copied from CRM [id number]. Topic: Clinical - Red Word Triage >> Dec 25, 2023 12:08 PM Antwanette L wrote: Red Word that prompted transfer to Nurse Triage: chest pain Reason for Disposition  [1] Chest pain lasts > 5 minutes AND [2] occurred > 3 days ago (72 hours) AND [3] NO chest pain or cardiac symptoms now  Answer Assessment - Initial Assessment Questions 1. LOCATION: Where does it hurt?       Bilateral chest pain 2. RADIATION: Does the pain go anywhere else? (e.g., into neck, jaw, arms, back)     No radiation 3. ONSET: When did the chest pain begin? (Minutes, hours or days)      Started two months ago 4. PATTERN: Does the pain come and go, or has it been constant since it started?  Does it get worse with exertion?      Comes and goes 5. DURATION: How long does it last (e.g., seconds, minutes, hours)     20 minutes 6. SEVERITY: How bad is the pain?  (e.g., Scale 1-10; mild, moderate, or severe)     6 out of 10 7. CARDIAC RISK FACTORS: Do you have any history of heart problems or risk factors for heart disease? (e.g., angina, prior heart attack; diabetes, high blood pressure, high cholesterol, smoker, or strong family history of heart disease)     Family history in family 8. PULMONARY RISK FACTORS: Do you have any history of lung disease?  (e.g., blood clots in lung, asthma, emphysema, birth control pills)     Lung disease runs in family 9. CAUSE: What do you  think is causing the chest pain?     unsure 10. OTHER SYMPTOMS: Do you have any other symptoms? (e.g., dizziness, nausea, vomiting, sweating, fever, difficulty breathing, cough)       Nausea and headache when chest pain comes on 11. PREGNANCY: Is there any chance you are pregnant? When was your last menstrual period?       No  Patient reports no active chest pain today. Calling initially to ask for lab orders. Patient was being seen by Dr.Dixon. Attempted to schedule a TOC appointment. Explained next available appointment is in September. Patient is asking for someone from the office to call her-patient thought her care was transferred over to another provider in the office. Patient is asking for follow up phone call in regards to scheduling.  Protocols used: Chest Pain-A-AH

## 2023-12-31 ENCOUNTER — Ambulatory Visit: Admitting: Nurse Practitioner

## 2024-01-05 ENCOUNTER — Ambulatory Visit: Admitting: Nurse Practitioner

## 2024-01-12 ENCOUNTER — Ambulatory Visit: Admitting: Nurse Practitioner

## 2024-01-19 ENCOUNTER — Other Ambulatory Visit: Payer: Self-pay

## 2024-01-19 DIAGNOSIS — R1013 Epigastric pain: Secondary | ICD-10-CM

## 2024-01-19 DIAGNOSIS — R109 Unspecified abdominal pain: Secondary | ICD-10-CM

## 2024-01-19 MED ORDER — DICYCLOMINE HCL 10 MG PO CAPS: 10.0000 mg | ORAL_CAPSULE | Freq: Three times a day (TID) | ORAL | 1 refills | Status: DC

## 2024-01-20 ENCOUNTER — Ambulatory Visit (INDEPENDENT_AMBULATORY_CARE_PROVIDER_SITE_OTHER): Admitting: Clinical

## 2024-01-20 DIAGNOSIS — F4310 Post-traumatic stress disorder, unspecified: Secondary | ICD-10-CM

## 2024-01-20 DIAGNOSIS — F411 Generalized anxiety disorder: Secondary | ICD-10-CM | POA: Diagnosis not present

## 2024-01-20 DIAGNOSIS — F32A Depression, unspecified: Secondary | ICD-10-CM | POA: Diagnosis not present

## 2024-01-20 DIAGNOSIS — F902 Attention-deficit hyperactivity disorder, combined type: Secondary | ICD-10-CM | POA: Diagnosis not present

## 2024-01-20 DIAGNOSIS — F410 Panic disorder [episodic paroxysmal anxiety] without agoraphobia: Secondary | ICD-10-CM

## 2024-01-20 DIAGNOSIS — F331 Major depressive disorder, recurrent, moderate: Secondary | ICD-10-CM

## 2024-01-20 NOTE — Progress Notes (Signed)
 Virtual Visit via Video Note   I connected with Patty Gomez on 01/20/24 at 11:00 AM EDT by a video enabled telemedicine application and verified that I am speaking with the correct person using two identifiers.   Location: Patient: home Provider: office   I discussed the limitations of evaluation and management by telemedicine and the availability of in person appointments. The patient expressed understanding and agreed to proceed.     THERAPY PROGRESS NOTE   Session Time: 11:00 AM- 11:30 AM   Participation Level: Active   Behavioral Response: CasualAlert/Hyperverbal   Type of Therapy: Individual Therapy   Treatment Goals addressed: Coping   Interventions: CBT, DBT, Solution Focused, Strength-based and Supportive   Summary: Patty Gomez is a 23y.o. female who presents with Depression, Anxiety, PTSD, ADHD . The OPT therapist worked with the patient for her OPT treatment session. The OPT therapist utilized Motivational Interviewing to assist in creating therapeutic repore. The patient in the session was engaged and work in collaboration giving feedback about her triggers and symptoms over the past few weeks. The patient noted she has been working consistently at the [employer] her job. The patients has continued to check in with her probation office on a weekly basis. The patient spoke about serving  finishing the weekends of her jail sentence.The patient spoke about ongoing med therapy with Shavon [name]. The patient spoke about positive changes since her last session including working again at a [employer], finishing her weekends in [place]. The patient has 2 remaining classes to attend and will be continuing her probation until she completes the full requirements from the court and then will be completely out of the legal system. The OPT therapist worked with the patient on setting her morning routine in place and getting consistency as well as self reinvestment leaning into  thinks she enjoys including her now being able to go to church on Sundays. The OPT therapist worked with the patient providing support, encouragement, empowerment as she continues to work herself back out of the legal system, through family loss truama, and get to consistency in finding a healthy baseline.   Suicidal/Homicidal: Nowithout intent/plan   Therapist Response:The OPT therapist worked with the patient for the patients scheduled session. The patient was engaged in her session and gave feedback in relation to triggers, symptoms, and behavior responses over the past few weeks. The patient spoke about finishing her Jail term. The patient has fines to pay off and 2 classes to finish, and will continue her probation until she finishes completely her sentence requirements. The OPT therapist worked with the patient utilizing an in session Cognitive Behavioral Therapy exercise. The patient was responsive in the session and verbalized,  I am doing good I am working a lot at [employer] and I have my weekends free now and I am finished with my weekends in [place] and I am never going back. The OPT therapist worked with the patient overviewing basic health areas including sleep cycle, eating habits, hygiene, and physical exercise. The patient in this session verbalized no current S/I or H/I. The OPT therapist worked with the patient on planning and working on her goals finishing her legal requirements.  The patient spoke about her work with psychiatrist [name].The patient spoke about her daily affirmations and this helping her to have more positive outlook. The patient spoke about today being her Fathers birthday and he has passed so the patient spoke about just celebrating her Father and not being in a place  of sadness and trying to have more positive thinking. The patient spoke about her faith being a large protective factor and her plans to get back into church now that she is not serving weekends in  [place]. The OPT therapist worked with the patient promoting her compliance with med management. The patient spoke about her realization of her strength as a person and a inspiration to others.The OPT therapist will continue treatment work with the patient in her next scheduled session.   Plan: Return again in 2/3 weeks.   Diagnosis:      Axis I:Depression/ GAD/PTSD/ADHD                           Axis II: No diagnosis       Collaboration of Care:  Overview of the patient involvement in the Med Management   Patient/Guardian was advised Release of Information must be obtained prior to any record release in order to collaborate their care with an outside provider. Patient/Guardian was advised if they have not already done so to contact the registration department to sign all necessary forms in order for us  to release information regarding their care.    Consent: Patient/Guardian gives verbal consent for treatment and assignment of benefits for services provided during this visit. Patient/Guardian expressed understanding and agreed to proceed.    I discussed the assessment and treatment plan with the patient. The patient was provided an opportunity to ask questions and all were answered. The patient agreed with the plan and demonstrated an understanding of the instructions.   The patient was advised to call back or seek an in-person evaluation if the symptoms worsen or if the condition fails to improve as anticipated.   I provided 30 minutes of non-face-to-face time during this encounter.   Jerel Pepper, LCSW   01/20/2024

## 2024-01-21 ENCOUNTER — Encounter: Payer: Self-pay | Admitting: Nurse Practitioner

## 2024-01-26 ENCOUNTER — Telehealth (HOSPITAL_COMMUNITY): Admitting: Registered Nurse

## 2024-01-26 ENCOUNTER — Encounter (HOSPITAL_COMMUNITY): Payer: Self-pay | Admitting: Registered Nurse

## 2024-01-26 DIAGNOSIS — F410 Panic disorder [episodic paroxysmal anxiety] without agoraphobia: Secondary | ICD-10-CM | POA: Diagnosis not present

## 2024-01-26 DIAGNOSIS — F411 Generalized anxiety disorder: Secondary | ICD-10-CM | POA: Diagnosis not present

## 2024-01-26 DIAGNOSIS — G4700 Insomnia, unspecified: Secondary | ICD-10-CM

## 2024-01-26 DIAGNOSIS — F50819 Binge eating disorder, unspecified: Secondary | ICD-10-CM

## 2024-01-26 DIAGNOSIS — F902 Attention-deficit hyperactivity disorder, combined type: Secondary | ICD-10-CM

## 2024-01-26 MED ORDER — AMITRIPTYLINE HCL 25 MG PO TABS: 25.0000 mg | ORAL_TABLET | Freq: Every day | ORAL | 3 refills | Status: DC

## 2024-01-26 MED ORDER — LISDEXAMFETAMINE DIMESYLATE 40 MG PO CAPS: 40.0000 mg | ORAL_CAPSULE | Freq: Every day | ORAL | 0 refills | Status: DC

## 2024-01-26 MED ORDER — VENLAFAXINE HCL ER 75 MG PO CP24: 75.0000 mg | ORAL_CAPSULE | Freq: Every day | ORAL | 3 refills | Status: DC

## 2024-01-26 MED ORDER — HYDROXYZINE HCL 10 MG PO TABS: ORAL_TABLET | ORAL | 3 refills | Status: DC

## 2024-01-26 NOTE — Patient Instructions (Signed)

## 2024-01-26 NOTE — Progress Notes (Signed)
 [facility] MD/PA/NP OP Progress Note  01/26/2024 12:29 PM Patty Gomez  MRN:  [id number]  Virtual Visit via Video Note  I connected with Patty Gomez on 01/26/24 at 11:00 AM EDT by a video enabled telemedicine application and verified that I am speaking with the correct person using two identifiers.  Location: Patient: Home Provider: [facility] [place]   I discussed the limitations of evaluation and management by telemedicine and the availability of in person appointments. The patient expressed understanding and agreed to proceed.    I discussed the assessment and treatment plan with the patient. The patient was provided an opportunity to ask questions and all were answered. The patient agreed with the plan and demonstrated an understanding of the instructions.   The patient was advised to call back or seek an in-person evaluation if the symptoms worsen or if the condition fails to improve as anticipated.  I provided 30 minutes of non-face-to-face time during this encounter.   Luisa Ruder, NP   Chief Complaint:  No chief complaint on file.  HPI: Patty Gomez 23 y.o. female presents today for medication management follow up.  She is seen via virtual video visit by this provider, and chart reviewed on 01/26/24.  Her psychiatric history is significant for PTSD, ADHD, major depressive disorder, generalized anxiety disorder with panic attacks, and binge eating.  Her mental health is currently managed with Effexor  XR 37.5 mg daily, Vyvanse  40 mg daily, and Vistaril  10-20 mg Tid prn.  She reports current medication regimen is effectively managing her mental health without adverse reaction and reports that she feels better and has noticed improvement in depression, anxiety, mood, and sleep.  Also reports that focus and concentration is better.  State that she is no longer drinking Monster caffeine drinks and feels that she also feels that has helped.  Today she denies  suicidal/self-harm/homicidal ideation, psychosis, paranoia, and abnormal movements.      Recommended the following: Continue Vyvanse  40 mg daily, Vistaril  10-20 mg 3 times daily as needed, Effexor  XR 75 mg daily.  Follow-up 3 month, Continue counseling with Jerel Pepper, LCSW.  She voices understanding with information being given to her today and is agreeable to recommendations.    Visit Diagnosis:    ICD-10-CM   1. Attention deficit hyperactivity disorder (ADHD), combined type  F90.2 venlafaxine  XR (EFFEXOR -XR) 75 MG 24 hr capsule    lisdexamfetamine (VYVANSE ) 40 MG capsule    lisdexamfetamine (VYVANSE ) 40 MG capsule    lisdexamfetamine (VYVANSE ) 40 MG capsule    2. Generalized anxiety disorder with panic attacks  F41.1 venlafaxine  XR (EFFEXOR -XR) 75 MG 24 hr capsule   F41.0 hydrOXYzine  (ATARAX ) 10 MG tablet    3. Insomnia, unspecified type  G47.00 hydrOXYzine  (ATARAX ) 10 MG tablet    4. Binge eating disorder, unspecified severity  F50.819 lisdexamfetamine (VYVANSE ) 40 MG capsule    lisdexamfetamine (VYVANSE ) 40 MG capsule    lisdexamfetamine (VYVANSE ) 40 MG capsule      Past Psychiatric History: PTSD, ADHD, major depressive disorder, generalized anxiety disorder with panic attacks, and binge eating   Past Medical History:  Past Medical History:  Diagnosis Date   Acute bronchitis 01/20/2013   Anxiety state 08/22/2015   Depression    Encounter for IUD insertion 01/27/2018   Hair loss 03/11/2022   Iron deficiency 05/15/2022   Migraines    Pain and swelling of toe of left foot 02/11/2022   Preventative health care 02/11/2022   Screening examination for STD (sexually transmitted disease)  03/20/2021   Trichimoniasis    Vaginal burning 03/20/2021   Vaginal discharge 03/20/2021   Vaginal irritation 06/04/2021    Past Surgical History:  Procedure Laterality Date   BIOPSY  05/07/2019   Procedure: BIOPSY;  Surgeon: Harvey Margo CROME, MD;  [facility];  Service:  Endoscopy;;   BIOPSY  08/23/2022   Procedure: BIOPSY;  Surgeon: Cindie Carlin POUR, DO;  [facility];  Service: Endoscopy;;   COLONOSCOPY     ESOPHAGOGASTRODUODENOSCOPY N/A 05/07/2019   Procedure: ESOPHAGOGASTRODUODENOSCOPY (EGD);  Surgeon: Harvey Margo CROME, MD;  [facility];  Service: Endoscopy;  Laterality: N/A;  3:00pm   ESOPHAGOGASTRODUODENOSCOPY (EGD) WITH PROPOFOL  N/A 08/23/2022   Procedure: ESOPHAGOGASTRODUODENOSCOPY (EGD) WITH PROPOFOL ;  Surgeon: Cindie Carlin POUR, DO;  [facility];  Service: Endoscopy;  Laterality: N/A;  1:15 pm ,asa 2   TONSILLECTOMY      Family Psychiatric History: See below in family history  Family History:  Family History  Problem Relation Age of Onset   Bipolar disorder Mother    Cancer Mother    Hypertension Paternal Grandmother    Diabetes Paternal Grandmother    Hyperlipidemia Paternal Grandmother    Heart disease Paternal Grandmother    Colon polyps Paternal Grandmother    Diabetes Other    Cancer Other    Colon cancer Neg Hx     Social History:  Social History   Socioeconomic History   Marital status: Single    Spouse name: Not on file   Number of children: Not on file   Years of education: Not on file   Highest education level: Some college, no degree  Occupational History   Not on file  Tobacco Use   Smoking status: Some Days    Types: E-cigarettes    Last attempt to quit: 05/2022    Years since quitting: 1.6   Smokeless tobacco: Never  Vaping Use   Vaping status: Some Days  Substance and Sexual Activity   Alcohol use: Yes    Comment: occ   Drug use: Not Currently    Types: Marijuana    Comment: tried marijuana a few times as a teenager; hit vape pen with THC in 2024 by accident   Sexual activity: Yes    Birth control/protection: I.U.D.  Other Topics Concern   Not on file  Social History Narrative   ** Merged History Encounter **       Lives with her grandmother. RCC in spring 2021.     Social Drivers of Corporate investment banker Strain: Low Risk  (01/04/2024)   Overall Financial Resource Strain (CARDIA)    Difficulty of Paying Living Expenses: Not very hard  Food Insecurity: No Food Insecurity (01/04/2024)   Hunger Vital Sign    Worried About Running Out of Food in the Last Year: Never true    Ran Out of Food in the Last Year: Never true  Transportation Needs: No Transportation Needs (01/04/2024)   PRAPARE - Administrator, Civil Service (Medical): No    Lack of Transportation (Non-Medical): No  Physical Activity: Sufficiently Active (01/04/2024)   Exercise Vital Sign    Days of Exercise per Week: 3 days    Minutes of Exercise per Session: 60 min  Stress: Stress Concern Present (01/04/2024)   [employer] of Occupational Health - Occupational Stress Questionnaire    Feeling of Stress: To some extent  Social Connections: Socially Integrated (01/04/2024)   Social Connection and  Isolation Panel    Frequency of Communication with Friends and Family: Twice a week    Frequency of Social Gatherings with Friends and Family: Twice a week    Attends Religious Services: 1 to 4 times per year    Active Member of [employer] or Organizations: Yes    Attends Banker Meetings: 1 to 4 times per year    Marital Status: Living with partner    Allergies:  Allergies  Allergen Reactions   Dust Mite Extract     Metabolic Disorder Labs: Lab Results  Component Value Date   HGBA1C 5.1 09/29/2023   MPG 100 09/29/2023   Lab Results  Component Value Date   PROLACTIN 10.0 09/29/2023   Lab Results  Component Value Date   CHOL 157 09/29/2023   TRIG 41 09/29/2023   HDL 77 09/29/2023   CHOLHDL 2.0 09/29/2023   LDLCALC 68 09/29/2023   LDLCALC 58 10/17/2022   Lab Results  Component Value Date   TSH 0.79 09/29/2023   TSH 0.953 10/17/2022    Current Medications: Current Outpatient Medications  Medication Sig Dispense Refill   [START ON 02/26/2024]  lisdexamfetamine (VYVANSE ) 40 MG capsule Take 1 capsule (40 mg total) by mouth daily at 12 noon. 30 capsule 0   [START ON 03/27/2024] lisdexamfetamine (VYVANSE ) 40 MG capsule Take 1 capsule (40 mg total) by mouth daily at 12 noon. 30 capsule 0   amitriptyline  (ELAVIL ) 25 MG tablet Take 25 mg by mouth at bedtime.     dicyclomine  (BENTYL ) 10 MG capsule Take 1 capsule (10 mg total) by mouth 4 (four) times daily -  before meals and at bedtime. As needed for cramping 120 capsule 1   hydrOXYzine  (ATARAX ) 10 MG tablet Take 10 - 20 mg (1 to 2 tablets) three times a day as needed for anxiety/sleep 60 tablet 3   levonorgestrel  (LILETTA ) 19.5 MCG/DAY IUD IUD 1 each by Intrauterine route once.     lisdexamfetamine (VYVANSE ) 40 MG capsule Take 1 capsule (40 mg total) by mouth daily. 30 capsule 0   ondansetron  (ZOFRAN ) 4 MG tablet Take 1 tablet (4 mg total) by mouth every 8 (eight) hours as needed for nausea or vomiting. 60 tablet 2   pantoprazole  (PROTONIX ) 40 MG tablet Take 1 tablet (40 mg total) by mouth 2 (two) times daily. 180 tablet 3   venlafaxine  XR (EFFEXOR -XR) 75 MG 24 hr capsule Take 1 capsule (75 mg total) by mouth daily with breakfast. 30 capsule 3   No current facility-administered medications for this visit.     Musculoskeletal: Strength & Muscle Tone: Unable to assess via virtual visit Gait & Station: Unable to assess via virtual visit Patient leans: N/A  Psychiatric Specialty Exam: Review of Systems  Constitutional:        No other complaints voiced  Respiratory:  Negative for apnea and shortness of breath.   Cardiovascular:  Negative for chest pain and palpitations.       Referral to PCP for further assessment to rule out any other issues  Psychiatric/Behavioral:  Positive for dysphoric mood (Stable). Negative for agitation (mood stable), decreased concentration (Stable), hallucinations, sleep disturbance and suicidal ideas. The patient is nervous/anxious (Stable).   All other systems  reviewed and are negative.   There were no vitals taken for this visit.There is no height or weight on file to calculate BMI.  General Appearance: Casual  Eye Contact:  Good  Speech:  Clear and Coherent and Normal Rate  Volume:  Normal  Mood:  Euthymic  Affect:  Congruent  Thought Process:  Coherent, Goal Directed, and Descriptions of Associations: Intact  Orientation:  Full (Time, Place, and Person)  Thought Content: Logical   Suicidal Thoughts:  No  Homicidal Thoughts:  No  Memory:  Immediate;   Good Recent;   Good Remote;   Good  Judgement:  Intact  Insight:  Present  Psychomotor Activity:  Normal  Concentration:  Concentration: Good and Attention Span: Good  Recall:  Good  Fund of Knowledge: Good  Language: Good  Akathisia:  No  Handed:  Right  AIMS (if indicated): not done  Assets:  Communication Skills Desire for Improvement Housing Leisure Time Physical Health Resilience Social Support Transportation  ADL's:  Intact  Cognition: WNL  Sleep:  Good   Screenings: AIMS    Flowsheet Row Video Visit from 12/04/2023 in [facility] [place]  AIMS Total Score 0   GAD-7    Flowsheet Row Video Visit from 12/04/2023 in [facility] [place] Office Visit from 09/25/2023 in [facility] [place] Office Visit from 04/28/2023 in [facility] [place] [facility] Office Visit from 01/17/2023 in [facility] [place] [facility] Office Visit from 10/17/2022 in [facility]  Total GAD-7 Score 0 19 0 15 16   PHQ2-9    Flowsheet Row Video Visit from 12/04/2023 in [facility] [place] Office Visit from 09/25/2023 in [facility] [place] Office Visit from 08/01/2023 in [facility] Office Visit from 04/28/2023 in [facility]  [facility] Office Visit from 01/17/2023 in [facility]  PHQ-2 Total Score 0 2 0 0 0  PHQ-9 Total Score -- 11 -- 0 0   Flowsheet Row Video Visit from 12/04/2023 in [facility] [place] Office Visit from 09/25/2023 in [facility] [place] Admission (Discharged) from 08/23/2022 in [place] [facility]  C-SSRS RISK CATEGORY No Risk No Risk No Risk   Assessment and Plan: Assessment: Patient seen and examined as noted above. Summary: Today Patty Gomez appears to be doing well.  She reports current medication regimen is effectively managing mental health without adverse reaction and has notice improvement in depression, anxiety, mood, focus, concentration, and sleep.  Reports she is eating and sleeping without difficulty.  Also informed she is no longer drinking Monster caffeine drink.  She denies suicidal/self-harm/homicidal ideations, psychosis, paranoia, and abnormal movements. During visit she is dressed appropriate for age and weather.  She is seated comfortably in view of camera with no noted distress.  She is alert/oriented x 4, calm/cooperative and mood is congruent with affect.  She spoke in a clear tone at moderate volume, and normal pace, with good eye contact.  Her thought process is coherent, relevant, and there is no indication that she is currently responding to internal/external stimuli or experiencing delusional thought content.    1. Attention deficit hyperactivity disorder (ADHD), combined type - venlafaxine  XR (EFFEXOR -XR) 75 MG 24 hr capsule; Take 1 capsule (75 mg total) by mouth daily with breakfast.  Dispense: 30 capsule; Refill: 3 - lisdexamfetamine (VYVANSE ) 40 MG capsule; Take 1 capsule (40 mg total) by mouth daily.  Dispense: 30 capsule; Refill: 0 - lisdexamfetamine (VYVANSE ) 40 MG capsule; Take 1 capsule (40 mg total) by mouth daily at 12 noon.  Dispense: 30 capsule; Refill: 0 -  lisdexamfetamine (VYVANSE ) 40 MG capsule; Take 1 capsule (40 mg total) by mouth daily at 12 noon.  Dispense: 30 capsule; Refill: 0  2. Generalized anxiety disorder with panic attacks - venlafaxine  XR (EFFEXOR -XR) 75 MG 24 hr capsule; Take 1 capsule (75 mg total) by mouth daily with breakfast.  Dispense: 30 capsule; Refill: 3 - hydrOXYzine  (ATARAX ) 10 MG tablet; Take 10 - 20 mg (1 to 2 tablets) three times a day as needed for anxiety/sleep  Dispense: 60 tablet; Refill: 3  3. Insomnia, unspecified type - hydrOXYzine  (ATARAX ) 10 MG tablet; Take 10 - 20 mg (1 to 2 tablets) three times a day as needed for anxiety/sleep  Dispense: 60 tablet; Refill: 3  4. Binge eating disorder, unspecified severity - lisdexamfetamine (VYVANSE ) 40 MG capsule; Take 1 capsule (40 mg total) by mouth daily.  Dispense: 30 capsule; Refill: 0 - lisdexamfetamine (VYVANSE ) 40 MG capsule; Take 1 capsule (40 mg total) by mouth daily at 12 noon.  Dispense: 30 capsule; Refill: 0 - lisdexamfetamine (VYVANSE ) 40 MG capsule; Take 1 capsule (40 mg total) by mouth daily at 12 noon.  Dispense: 30 capsule; Refill: 0    Plan: Medications: Meds ordered this encounter  Medications   venlafaxine  XR (EFFEXOR -XR) 75 MG 24 hr capsule    Sig: Take 1 capsule (75 mg total) by mouth daily with breakfast.    Dispense:  30 capsule    Refill:  3    Supervising Provider:   CURRY PATERSON T [[id number]]   hydrOXYzine  (ATARAX ) 10 MG tablet    Sig: Take 10 - 20 mg (1 to 2 tablets) three times a day as needed for anxiety/sleep    Dispense:  60 tablet    Refill:  3    Supervising Provider:   CURRY PATERSON T [[id number]]   lisdexamfetamine (VYVANSE ) 40 MG capsule    Sig: Take 1 capsule (40 mg total) by mouth daily.    Dispense:  30 capsule    Refill:  0    Supervising Provider:   ARFEEN, SYED T [[id number]]   lisdexamfetamine (VYVANSE ) 40 MG capsule    Sig: Take 1 capsule (40 mg total) by mouth daily at 12 noon.    Dispense:  30 capsule    Refill:  0     Supervising Provider:   ARFEEN, SYED T [[id number]]   lisdexamfetamine (VYVANSE ) 40 MG capsule    Sig: Take 1 capsule (40 mg total) by mouth daily at 12 noon.    Dispense:  30 capsule    Refill:  0    Supervising Provider:   CURRY PATERSON T [[id number]]   Labs: Not indicated at this time  Other:  Continue counseling/therapy with Jerel Pepper, LCSW.   Patty Gomez is instructed to call [phone], mobile crisis, or present to the nearest emergency room should she experience any suicidal/homicidal ideation, auditory/visual/hallucinations, or detrimental worsening of her mental health condition.  Patty Gomez  participated in the development of this treatment plan and verbalized her agreement with plan as listed.  Follow Up: Return in 3 month for medication management Call in the interim for any side-effects, decompensation, questions, or problems  Collaboration of Care: Collaboration of Care: Medication Management AEB medication assessment and refills  Patient/Guardian was advised Release of Information must be obtained prior to any record release in order to collaborate their care with an outside provider. Patient/Guardian was advised if they have not already done so to contact the registration department  to sign all necessary forms in order for us  to release information regarding their care.   Consent: Patient/Guardian gives verbal consent for treatment and assignment of benefits for services provided during this visit. Patient/Guardian expressed understanding and agreed to proceed.    Patty Yeager, NP 01/26/2024, 12:29 PM

## 2024-02-10 ENCOUNTER — Ambulatory Visit: Admitting: Adult Health

## 2024-02-23 ENCOUNTER — Ambulatory Visit (HOSPITAL_COMMUNITY): Admitting: Clinical

## 2024-02-23 DIAGNOSIS — F410 Panic disorder [episodic paroxysmal anxiety] without agoraphobia: Secondary | ICD-10-CM

## 2024-02-23 DIAGNOSIS — F4310 Post-traumatic stress disorder, unspecified: Secondary | ICD-10-CM | POA: Diagnosis not present

## 2024-02-23 DIAGNOSIS — F32A Depression, unspecified: Secondary | ICD-10-CM | POA: Diagnosis not present

## 2024-02-23 DIAGNOSIS — F411 Generalized anxiety disorder: Secondary | ICD-10-CM | POA: Diagnosis not present

## 2024-02-23 DIAGNOSIS — F331 Major depressive disorder, recurrent, moderate: Secondary | ICD-10-CM

## 2024-02-23 DIAGNOSIS — F909 Attention-deficit hyperactivity disorder, unspecified type: Secondary | ICD-10-CM | POA: Diagnosis not present

## 2024-02-23 DIAGNOSIS — F902 Attention-deficit hyperactivity disorder, combined type: Secondary | ICD-10-CM

## 2024-02-23 NOTE — Progress Notes (Signed)
 Virtual Visit via Video Note   I connected with Patty Gomez on 02/23/24 at 11:00 AM EDT by a video enabled telemedicine application and verified that I am speaking with the correct person using two identifiers.   Location: Patient: home Provider: office   I discussed the limitations of evaluation and management by telemedicine and the availability of in person appointments. The patient expressed understanding and agreed to proceed.     THERAPY PROGRESS NOTE   Session Time: 11:00 AM- 11:30 AM   Participation Level: Active   Behavioral Response: CasualAlert/Hyperverbal   Type of Therapy: Individual Therapy   Treatment Goals addressed: Coping   Interventions: CBT, DBT, Solution Focused, Strength-based and Supportive   Summary: Patty Gomez is a 23y.o. female who presents with Depression, Anxiety, PTSD, ADHD . The OPT therapist worked with the patient for her OPT treatment session. The OPT therapist utilized Motivational Interviewing to assist in creating therapeutic repore. The patient in the session was engaged and work in collaboration giving feedback about her triggers and symptoms over the past few weeks. The patient spoke about recovering over the last few days from recent dental surgery. The patient noted she is no longer working at [employer] job and is currently searching for a new job. The patients has continued to check in with her probation office on a weekly basis. The patient spoke about remaining classes to attend and will be continuing her probation until she completes the full requirements from the court and then will be completely out of the legal system. The OPT therapist worked with the patient on setting her morning routine in place and getting consistency as well as self reinvestment leaning into thinks she enjoys including her now being able to go to church on Sundays. The patient spoke about her improvement with self care and taking time for herself. The OPT  therapist worked with the patient providing support, encouragement, empowerment as she continues to work herself back out of the legal system, through family loss truama, and get to consistency in finding a healthy baseline. The patient spoke about her ongoing work to get completely out of the legal system, get a new job, and move forward. The patient will have a DUI assessment and classes she will be taking pay fines and then she will be completely out of the legal system. The patient spoke about looking forward to the Fall season and decorating at home and enjoying picking out Fall theme decor. Additionally the patient spoke about being active and doing well with cleaning at home and keeping her space clean in her grandparents home. The patient spoke about cleaning out her brother who passed away 1 year ago September 06, 2025belongings and starting new chapter for herself.  The patient spoke about she felt she handled the anniversary of her brothers passing very well.The patient spoke about her recent med therapy appointment and noted she needs to make a follow up appointment and was encouraged to call today to schedule that from the OPT.    Suicidal/Homicidal: Nowithout intent/plan   Therapist Response:The OPT therapist worked with the patient for the patients scheduled session. The patient was engaged in her session and gave feedback in relation to triggers, symptoms, and behavior responses over the past few weeks. The patient spoke about having 2 classes to finish, and will continue her probation until she finishes completely her sentence requirements including doing a alcohol/drug assessment and classes connected with this , pay fines, and she will then be out  of the legal system. The OPT therapist worked with the patient utilizing an in session Cognitive Behavioral Therapy exercise. The patient was responsive in the session and verbalized,  I would like to be back on Prozac  and a higher level of my ADHD  medicine and I am going to talk with [facility] about this I am going to call today and make a follow up appointment. The OPT therapist worked with the patient overviewing basic health areas including sleep cycle, eating habits, hygiene, and physical exercise. The patient in this session verbalized no current S/I or H/I. The OPT therapist worked with the patient on planning and working on her goals finishing her legal requirements.  The patient spoke about her work with psychiatrist [name].The patient spoke about her daily affirmations and this helping her to have more positive outlook. The patient spoke about being excited for the Fall season and this motivating her to clean more and when things are clean this boosts her mood. The patient spoke about sending time with her boyfriend at his home. The OPT therapist worked with the patient promoting her compliance with med management. The patient spoke about her realization of her strength as a person and a inspiration to others.The OPT therapist will continue treatment work with the patient in her next scheduled session.   Plan: Return again in 2/3 weeks.   Diagnosis:      Axis I:Depression/ GAD/PTSD/ADHD                           Axis II: No diagnosis       Collaboration of Care:  Overview of the patient involvement in the Med Management   Patient/Guardian was advised Release of Information must be obtained prior to any record release in order to collaborate their care with an outside provider. Patient/Guardian was advised if they have not already done so to contact the registration department to sign all necessary forms in order for us  to release information regarding their care.    Consent: Patient/Guardian gives verbal consent for treatment and assignment of benefits for services provided during this visit. Patient/Guardian expressed understanding and agreed to proceed.    I discussed the assessment and treatment plan with the patient. The  patient was provided an opportunity to ask questions and all were answered. The patient agreed with the plan and demonstrated an understanding of the instructions.   The patient was advised to call back or seek an in-person evaluation if the symptoms worsen or if the condition fails to improve as anticipated.   I provided 30 minutes of non-face-to-face time during this encounter.   Jerel Pepper, LCSW   01/23/2024

## 2024-03-01 ENCOUNTER — Telehealth (HOSPITAL_COMMUNITY): Payer: Self-pay | Admitting: *Deleted

## 2024-03-01 NOTE — Telephone Encounter (Signed)
 Patient called and lmom stating she is needing refills for her medication and was not able to get more refills with the app. Staff called patient and lmom and informed her that she may have to call the pharmacy and speak with a live rep if she's requesting the vyvanse  and not through the app. Office number was left on voicemail for patient to call if she's still having issue with getting her medication from pharmacy.

## 2024-03-22 ENCOUNTER — Ambulatory Visit (HOSPITAL_COMMUNITY): Admitting: Clinical

## 2024-03-26 ENCOUNTER — Telehealth (HOSPITAL_COMMUNITY): Payer: Self-pay | Admitting: *Deleted

## 2024-03-26 ENCOUNTER — Encounter (HOSPITAL_COMMUNITY): Payer: Self-pay

## 2024-03-26 ENCOUNTER — Ambulatory Visit (HOSPITAL_COMMUNITY): Admitting: Clinical

## 2024-03-26 NOTE — Telephone Encounter (Signed)
 Opened in Error.

## 2024-04-08 ENCOUNTER — Telehealth (HOSPITAL_COMMUNITY): Admitting: Registered Nurse

## 2024-04-08 ENCOUNTER — Encounter (HOSPITAL_COMMUNITY): Payer: Self-pay | Admitting: Registered Nurse

## 2024-04-08 DIAGNOSIS — G4700 Insomnia, unspecified: Secondary | ICD-10-CM

## 2024-04-08 DIAGNOSIS — F410 Panic disorder [episodic paroxysmal anxiety] without agoraphobia: Secondary | ICD-10-CM

## 2024-04-08 DIAGNOSIS — F50819 Binge eating disorder, unspecified: Secondary | ICD-10-CM

## 2024-04-08 DIAGNOSIS — F902 Attention-deficit hyperactivity disorder, combined type: Secondary | ICD-10-CM

## 2024-04-08 DIAGNOSIS — F411 Generalized anxiety disorder: Secondary | ICD-10-CM

## 2024-04-08 MED ORDER — FLUOXETINE HCL 20 MG PO CAPS: 20.0000 mg | ORAL_CAPSULE | Freq: Every day | ORAL | 1 refills | Status: DC

## 2024-04-08 MED ORDER — LISDEXAMFETAMINE DIMESYLATE 50 MG PO CAPS: 50.0000 mg | ORAL_CAPSULE | Freq: Every day | ORAL | 0 refills | Status: DC

## 2024-04-08 MED ORDER — HYDROXYZINE HCL 10 MG PO TABS: ORAL_TABLET | ORAL | 1 refills | Status: DC

## 2024-04-08 NOTE — Patient Instructions (Signed)

## 2024-04-08 NOTE — Progress Notes (Signed)
 [facility] MD/PA/NP OP Progress Note  04/08/2024 8:31 AM Caraline Lieurance  MRN:  [id number]  Virtual Visit via Video Note  I connected with Nami Alderman on 04/08/24 at  8:00 AM EDT by a video enabled telemedicine application and verified that I am speaking with the correct person using two identifiers.  Location: Patient: Home Provider: [facility] [place]   I discussed the limitations of evaluation and management by telemedicine and the availability of in person appointments. The patient expressed understanding and agreed to proceed.    I discussed the assessment and treatment plan with the patient. The patient was provided an opportunity to ask questions and all were answered. The patient agreed with the plan and demonstrated an understanding of the instructions.   The patient was advised to call back or seek an in-person evaluation if the symptoms worsen or if the condition fails to improve as anticipated.  I provided 30 minutes of non-face-to-face time during this encounter.   Luisa Ruder, NP   Chief Complaint:  Chief Complaint  Patient presents with   Follow-up    Medication management   HPI: Mikylah Bohanon 23 y.o. female presents today for medication management follow up.  She is seen via virtual video visit by this provider, and chart reviewed on 04/08/24.  Her psychiatric history is significant for PTSD, ADHD, major depressive disorder, generalized anxiety disorder with panic attacks, and binge eating.  Her mental health is currently managed with Effexor  XR 37.5 mg daily, Vyvanse  40 mg daily, and Vistaril  10-20 mg Tid prn.  She reports she has not been compliant with her medications.  Reports she continues to take her Vyvanse  and Vistaril  but does not feel that the Vyvanse  is working because she has been overeating and binging.  She reports that she has not taking her Effexor  for at least 2 months.  Reports she felt the Prozac  worked better and would like to restart Prozac .   Discussed her compliance with medications and forming medications were not working she has not taken them.  Reports one of the newer stresses is she just lost her job about a month ago and now she is unemployed. She denies suicidal/self-harm/homicidal ideation, psychosis, paranoia, and abnormal movements.  Screenings completed during today's visit PHQ-9, C-SSRS, GAD-7, AIMS, AUDIT, Nutrition, and Pain, see scores below.    Recommended the following: Continue Vistaril  10-20 mg 3 times daily as needed, increase Vyvanse  to 50 mg daily, start Prozac  20 mg daily, discontinue Effexor  XR 75 mg daily.  She voices understanding with information being given to her today and is agreeable to recommendations.    Visit Diagnosis:    ICD-10-CM   1. Attention deficit hyperactivity disorder (ADHD), combined type  F90.2 lisdexamfetamine (VYVANSE ) 50 MG capsule    2. Binge eating disorder, unspecified severity  F50.819 lisdexamfetamine (VYVANSE ) 50 MG capsule    3. Generalized anxiety disorder with panic attacks  F41.1 hydrOXYzine  (ATARAX ) 10 MG tablet   F41.0     4. Insomnia, unspecified type  G47.00 hydrOXYzine  (ATARAX ) 10 MG tablet       Past Psychiatric History: PTSD, ADHD, major depressive disorder, generalized anxiety disorder with panic attacks, and binge eating   Past Medical History:  Past Medical History:  Diagnosis Date   Acute bronchitis 01/20/2013   Anxiety state 08/22/2015   Depression    Encounter for IUD insertion 01/27/2018   Hair loss 03/11/2022   Iron deficiency 05/15/2022   Migraines    Pain and swelling of toe of  left foot 02/11/2022   Preventative health care 02/11/2022   Screening examination for STD (sexually transmitted disease) 03/20/2021   Trichimoniasis    Vaginal burning 03/20/2021   Vaginal discharge 03/20/2021   Vaginal irritation 06/04/2021    Past Surgical History:  Procedure Laterality Date   BIOPSY  05/07/2019   Procedure: BIOPSY;  Surgeon: Harvey Margo CROME,  MD;  [facility];  Service: Endoscopy;;   BIOPSY  08/23/2022   Procedure: BIOPSY;  Surgeon: Cindie Carlin POUR, DO;  [facility];  Service: Endoscopy;;   COLONOSCOPY     ESOPHAGOGASTRODUODENOSCOPY N/A 05/07/2019   Procedure: ESOPHAGOGASTRODUODENOSCOPY (EGD);  Surgeon: Harvey Margo CROME, MD;  [facility];  Service: Endoscopy;  Laterality: N/A;  3:00pm   ESOPHAGOGASTRODUODENOSCOPY (EGD) WITH PROPOFOL  N/A 08/23/2022   Procedure: ESOPHAGOGASTRODUODENOSCOPY (EGD) WITH PROPOFOL ;  Surgeon: Cindie Carlin POUR, DO;  [facility];  Service: Endoscopy;  Laterality: N/A;  1:15 pm ,asa 2   TONSILLECTOMY      Family Psychiatric History: See below in family history  Family History:  Family History  Problem Relation Age of Onset   Bipolar disorder Mother    Cancer Mother    Hypertension Paternal Grandmother    Diabetes Paternal Grandmother    Hyperlipidemia Paternal Grandmother    Heart disease Paternal Grandmother    Colon polyps Paternal Grandmother    Diabetes Other    Cancer Other    Colon cancer Neg Hx     Social History:  Social History   Socioeconomic History   Marital status: Single    Spouse name: Not on file   Number of children: Not on file   Years of education: Not on file   Highest education level: Some college, no degree  Occupational History   Not on file  Tobacco Use   Smoking status: Some Days    Types: E-cigarettes    Last attempt to quit: 05/2022    Years since quitting: 1.8   Smokeless tobacco: Never  Vaping Use   Vaping status: Some Days  Substance and Sexual Activity   Alcohol use: Yes    Comment: occ   Drug use: Not Currently    Types: Marijuana    Comment: tried marijuana a few times as a teenager; hit vape pen with THC in 2024 by accident   Sexual activity: Yes    Birth control/protection: I.U.D.  Other Topics Concern   Not on file  Social History Narrative   ** Merged History Encounter **       Lives with her  grandmother. RCC in spring 2021.    Social Drivers of Corporate investment banker Strain: Low Risk  (01/04/2024)   Overall Financial Resource Strain (CARDIA)    Difficulty of Paying Living Expenses: Not very hard  Food Insecurity: No Food Insecurity (01/04/2024)   Hunger Vital Sign    Worried About Running Out of Food in the Last Year: Never true    Ran Out of Food in the Last Year: Never true  Transportation Needs: No Transportation Needs (01/04/2024)   PRAPARE - Administrator, Civil Service (Medical): No    Lack of Transportation (Non-Medical): No  Physical Activity: Sufficiently Active (01/04/2024)   Exercise Vital Sign    Days of Exercise per Week: 3 days    Minutes of Exercise per Session: 60 min  Stress: Stress Concern Present (01/04/2024)   [employer] of Occupational Health - Occupational Stress Questionnaire  Feeling of Stress: To some extent  Social Connections: Socially Integrated (01/04/2024)   Social Connection and Isolation Panel    Frequency of Communication with Friends and Family: Twice a week    Frequency of Social Gatherings with Friends and Family: Twice a week    Attends Religious Services: 1 to 4 times per year    Active Member of [employer] or Organizations: Yes    Attends Banker Meetings: 1 to 4 times per year    Marital Status: Living with partner    Allergies:  Allergies  Allergen Reactions   Dust Mite Extract     Metabolic Disorder Labs: Lab Results  Component Value Date   HGBA1C 5.1 09/29/2023   MPG 100 09/29/2023   Lab Results  Component Value Date   PROLACTIN 10.0 09/29/2023   Lab Results  Component Value Date   CHOL 157 09/29/2023   TRIG 41 09/29/2023   HDL 77 09/29/2023   CHOLHDL 2.0 09/29/2023   LDLCALC 68 09/29/2023   LDLCALC 58 10/17/2022   Lab Results  Component Value Date   TSH 0.79 09/29/2023   TSH 0.953 10/17/2022    Current Medications: Current Outpatient Medications  Medication Sig  Dispense Refill   FLUoxetine  (PROZAC ) 20 MG capsule Take 1 capsule (20 mg total) by mouth daily. 30 capsule 1   amitriptyline  (ELAVIL ) 25 MG tablet Take 1 tablet (25 mg total) by mouth at bedtime. 30 tablet 3   dicyclomine  (BENTYL ) 10 MG capsule Take 1 capsule (10 mg total) by mouth 4 (four) times daily -  before meals and at bedtime. As needed for cramping 120 capsule 1   hydrOXYzine  (ATARAX ) 10 MG tablet Take 10 - 20 mg (1 to 2 tablets) three times a day as needed for anxiety/sleep 60 tablet 1   levonorgestrel  (LILETTA ) 19.5 MCG/DAY IUD IUD 1 each by Intrauterine route once.     lisdexamfetamine (VYVANSE ) 50 MG capsule Take 1 capsule (50 mg total) by mouth daily. 30 capsule 0   ondansetron  (ZOFRAN ) 4 MG tablet Take 1 tablet (4 mg total) by mouth every 8 (eight) hours as needed for nausea or vomiting. 60 tablet 2   pantoprazole  (PROTONIX ) 40 MG tablet Take 1 tablet (40 mg total) by mouth 2 (two) times daily. 180 tablet 3   No current facility-administered medications for this visit.     Musculoskeletal: Strength & Muscle Tone: Unable to assess via virtual visit Gait & Station: Unable to assess via virtual visit Patient leans: N/A  Psychiatric Specialty Exam: Review of Systems  Constitutional:        No other complaints voiced  Cardiovascular:  Negative for chest pain.       Referral to PCP for further assessment to rule out any other issues  Psychiatric/Behavioral:  Positive for dysphoric mood (Stable). Negative for decreased concentration (Stable), hallucinations, sleep disturbance and suicidal ideas. Agitation: Reporting some irritability.The patient is nervous/anxious.   All other systems reviewed and are negative.   There were no vitals taken for this visit.There is no height or weight on file to calculate BMI.  General Appearance: Casual  Eye Contact:  Good  Speech:  Clear and Coherent and Normal Rate  Volume:  Normal  Mood:  Anxious and Euthymic  Affect:  Congruent  Thought  Process:  Coherent, Goal Directed, and Descriptions of Associations: Intact  Orientation:  Full (Time, Place, and Person)  Thought Content: Logical   Suicidal Thoughts:  No  Homicidal Thoughts:  No  Memory:  Immediate;   Good Recent;   Good Remote;   Good  Judgement:  Intact  Insight:  Present  Psychomotor Activity:  Normal  Concentration:  Concentration: Good and Attention Span: Good  Recall:  Good  Fund of Knowledge: Good  Language: Good  Akathisia:  No  Handed:  Right  AIMS (if indicated): not done  Assets:  Communication Skills Desire for Improvement Housing Leisure Time Physical Health Resilience Social Support Transportation  ADL's:  Intact  Cognition: WNL  Sleep:  Good   Screenings: AIMS    Flowsheet Row Video Visit from 04/08/2024 in [facility] [place] Video Visit from 12/04/2023 in [facility] at [place]  AIMS Total Score 0 0   GAD-7    Flowsheet Row Video Visit from 04/08/2024 in [facility] [place] Video Visit from 12/04/2023 in [facility] at [place] Office Visit from 09/25/2023 in [facility] at [place] Office Visit from 04/28/2023 in [facility] Office Visit from 01/17/2023 in [facility]  Total GAD-7 Score 3 0 19 0 15   PHQ2-9    Flowsheet Row Video Visit from 04/08/2024 in [facility] at [place] Video Visit from 12/04/2023 in [facility] at [place] Office Visit from 09/25/2023 in [facility] at [place] Office Visit from 08/01/2023 in [facility] Office Visit from 04/28/2023 in [facility]  PHQ-2 Total Score 1 0 2 0 0  PHQ-9 Total Score -- -- 11 -- 0   Flowsheet Row  Video Visit from 04/08/2024 in [facility] at [place] Video Visit from 12/04/2023 in [facility] at [place] Office Visit from 09/25/2023 in [facility] at [place]  C-SSRS RISK CATEGORY No Risk No Risk No Risk   Assessment and Plan: Assessment: Patient seen and examined as noted above. Summary: Today Manilla Laubacher appears to be doing well.  Although she reports noncompliance with medications.  Only taking Vyvanse  and Vistaril  as ordered.  Wanting to restart Prozac  because she felt it worked better than the Effexor .  Reports she has been binge eating and feels that she has gained at least 30 pounds over the last 3 months.  Reports she is sleeping without difficulty.  Also endorses some depression but more anxiety related to loss of job.  She denies suicidal/self-harm/homicidal ideation, psychosis, paranoia, abnormal movement. During visit she is dressed appropriate for age and weather.  She is seated comfortably in view of camera with no noted distress.  She is alert/oriented x 4, calm/cooperative and mood is congruent with affect.  She spoke in a clear tone at moderate volume, and normal pace, with good eye contact.  Her thought process is coherent, relevant, and there is no indication that she is currently responding to internal/external stimuli or experiencing delusional thought content.    1. Attention deficit hyperactivity disorder (ADHD), combined type - lisdexamfetamine (VYVANSE ) 50 MG capsule; Take 1 capsule (50 mg total) by mouth daily.  Dispense: 30 capsule; Refill: 0  2. Binge eating disorder, unspecified severity - lisdexamfetamine (VYVANSE ) 50 MG capsule; Take 1 capsule (50 mg total) by mouth daily.  Dispense: 30 capsule; Refill: 0  3. Generalized anxiety disorder with panic attacks - hydrOXYzine  (ATARAX ) 10 MG tablet; Take 10 - 20 mg (  1 to 2 tablets) three times a day as needed for anxiety/sleep   Dispense: 60 tablet; Refill: 1  4. Insomnia, unspecified type - hydrOXYzine  (ATARAX ) 10 MG tablet; Take 10 - 20 mg (1 to 2 tablets) three times a day as needed for anxiety/sleep  Dispense: 60 tablet; Refill: 1     Plan: Medications: Meds ordered this encounter  Medications   lisdexamfetamine (VYVANSE ) 50 MG capsule    Sig: Take 1 capsule (50 mg total) by mouth daily.    Dispense:  30 capsule    Refill:  0    Supervising Provider:   CURRY, SYED T [[id number]]   hydrOXYzine  (ATARAX ) 10 MG tablet    Sig: Take 10 - 20 mg (1 to 2 tablets) three times a day as needed for anxiety/sleep    Dispense:  60 tablet    Refill:  1    Supervising Provider:   CURRY PATERSON T [[id number]]   FLUoxetine  (PROZAC ) 20 MG capsule    Sig: Take 1 capsule (20 mg total) by mouth daily.    Dispense:  30 capsule    Refill:  1    Supervising Provider:   CURRY PATERSON T [[id number]]   Labs: Not indicated at this time  Other:  Continue counseling/therapy with Jerel Pepper, LCSW.   Glendi Andreason is instructed to call [phone], [phone], mobile crisis, or present to the nearest emergency room should she experience any suicidal/homicidal ideation, auditory/visual/hallucinations, or detrimental worsening of her mental health condition.  Zianne Stogner  participated in the development of this treatment plan and verbalized her agreement with plan as listed.  Follow Up: Return in 1 month for medication management Call in the interim for any side-effects, decompensation, questions, or problems  Collaboration of Care: Collaboration of Care: Medication Management AEB medication assessment and refills  Patient/Guardian was advised Release of Information must be obtained prior to any record release in order to collaborate their care with an outside provider. Patient/Guardian was advised if they have not already done so to contact the registration department to sign all necessary forms in order for us  to release information regarding their care.    Consent: Patient/Guardian gives verbal consent for treatment and assignment of benefits for services provided during this visit. Patient/Guardian expressed understanding and agreed to proceed.    Genelda Roark, NP 04/08/2024, 8:31 AM

## 2024-04-14 ENCOUNTER — Telehealth: Payer: Self-pay

## 2024-04-14 DIAGNOSIS — R1013 Epigastric pain: Secondary | ICD-10-CM

## 2024-04-14 DIAGNOSIS — R109 Unspecified abdominal pain: Secondary | ICD-10-CM

## 2024-04-14 NOTE — Telephone Encounter (Signed)
 Pt left message on my vm stating she need a refill on her Dicyclomine  and Amitriptyline . According to the records I see where Dicyclomine  was needed but Amitriptyline  had at one refill left. You approve I will send it. Please advise

## 2024-04-15 MED ORDER — AMITRIPTYLINE HCL 25 MG PO TABS: 25.0000 mg | ORAL_TABLET | Freq: Every day | ORAL | 3 refills | Status: AC

## 2024-04-15 MED ORDER — DICYCLOMINE HCL 10 MG PO CAPS: 10.0000 mg | ORAL_CAPSULE | Freq: Three times a day (TID) | ORAL | 3 refills | Status: AC

## 2024-04-15 NOTE — Addendum Note (Signed)
 Addended by: SHIRLEAN THERISA ORN on: 04/15/2024 04:47 PM   Modules accepted: Orders

## 2024-04-15 NOTE — Telephone Encounter (Signed)
 Pt phoned back again today wanting her dicyclomine  and Amitriptyline  refilled. Please advise

## 2024-04-20 ENCOUNTER — Emergency Department (HOSPITAL_COMMUNITY)

## 2024-04-20 ENCOUNTER — Encounter (HOSPITAL_COMMUNITY): Payer: Self-pay

## 2024-04-20 ENCOUNTER — Emergency Department (HOSPITAL_COMMUNITY)
Admission: EM | Admit: 2024-04-20 | Discharge: 2024-04-20 | Disposition: A | Discharge status: Home / Self Care | Attending: Emergency Medicine | Admitting: Emergency Medicine

## 2024-04-20 ENCOUNTER — Other Ambulatory Visit: Payer: Self-pay

## 2024-04-20 DIAGNOSIS — R220 Localized swelling, mass and lump, head: Secondary | ICD-10-CM | POA: Diagnosis not present

## 2024-04-20 DIAGNOSIS — S20419A Abrasion of unspecified back wall of thorax, initial encounter: Secondary | ICD-10-CM | POA: Insufficient documentation

## 2024-04-20 DIAGNOSIS — S41031A Puncture wound without foreign body of right shoulder, initial encounter: Secondary | ICD-10-CM

## 2024-04-20 DIAGNOSIS — S22019A Unspecified fracture of first thoracic vertebra, initial encounter for closed fracture: Secondary | ICD-10-CM | POA: Diagnosis not present

## 2024-04-20 DIAGNOSIS — S40812A Abrasion of left upper arm, initial encounter: Secondary | ICD-10-CM | POA: Diagnosis not present

## 2024-04-20 DIAGNOSIS — S41011A Laceration without foreign body of right shoulder, initial encounter: Secondary | ICD-10-CM | POA: Insufficient documentation

## 2024-04-20 DIAGNOSIS — S3993XA Unspecified injury of pelvis, initial encounter: Secondary | ICD-10-CM | POA: Diagnosis not present

## 2024-04-20 DIAGNOSIS — S80812A Abrasion, left lower leg, initial encounter: Secondary | ICD-10-CM | POA: Diagnosis not present

## 2024-04-20 DIAGNOSIS — S43004A Unspecified dislocation of right shoulder joint, initial encounter: Secondary | ICD-10-CM | POA: Diagnosis not present

## 2024-04-20 DIAGNOSIS — Z043 Encounter for examination and observation following other accident: Secondary | ICD-10-CM | POA: Diagnosis not present

## 2024-04-20 DIAGNOSIS — S40811A Abrasion of right upper arm, initial encounter: Secondary | ICD-10-CM | POA: Diagnosis not present

## 2024-04-20 DIAGNOSIS — S199XXA Unspecified injury of neck, initial encounter: Secondary | ICD-10-CM | POA: Diagnosis not present

## 2024-04-20 DIAGNOSIS — S0990XA Unspecified injury of head, initial encounter: Secondary | ICD-10-CM | POA: Diagnosis not present

## 2024-04-20 DIAGNOSIS — Z041 Encounter for examination and observation following transport accident: Secondary | ICD-10-CM | POA: Diagnosis not present

## 2024-04-20 DIAGNOSIS — M47812 Spondylosis without myelopathy or radiculopathy, cervical region: Secondary | ICD-10-CM | POA: Diagnosis not present

## 2024-04-20 DIAGNOSIS — S4991XA Unspecified injury of right shoulder and upper arm, initial encounter: Secondary | ICD-10-CM | POA: Diagnosis present

## 2024-04-20 DIAGNOSIS — T07XXXA Unspecified multiple injuries, initial encounter: Secondary | ICD-10-CM

## 2024-04-20 DIAGNOSIS — R000 Tachycardia, unspecified: Secondary | ICD-10-CM | POA: Diagnosis not present

## 2024-04-20 LAB — CBC
HCT: 40.7 % (ref 36.0–46.0)
Hemoglobin: 14 g/dL (ref 12.0–15.0)
MCH: 29.9 pg (ref 26.0–34.0)
MCHC: 34.4 g/dL (ref 30.0–36.0)
MCV: 87 fL (ref 80.0–100.0)
Platelets: 349 K/uL (ref 150–400)
RBC: 4.68 MIL/uL (ref 3.87–5.11)
RDW: 12.9 % (ref 11.5–15.5)
WBC: 10.3 K/uL (ref 4.0–10.5)
nRBC: 0 % (ref 0.0–0.2)

## 2024-04-20 LAB — COMPREHENSIVE METABOLIC PANEL WITH GFR
ALT: 59 U/L — ABNORMAL HIGH (ref 0–44)
AST: 46 U/L — ABNORMAL HIGH (ref 15–41)
Albumin: 4.9 g/dL (ref 3.5–5.0)
Alkaline Phosphatase: 92 U/L (ref 38–126)
Anion gap: 13 (ref 5–15)
BUN: 10 mg/dL (ref 6–20)
CO2: 26 mmol/L (ref 22–32)
Calcium: 9.5 mg/dL (ref 8.9–10.3)
Chloride: 102 mmol/L (ref 98–111)
Creatinine, Ser: 0.8 mg/dL (ref 0.44–1.00)
GFR, Estimated: >60 mL/min (ref >60)
Glucose, Bld: 128 mg/dL — ABNORMAL HIGH (ref 70–99)
Potassium: 3.8 mmol/L (ref 3.5–5.1)
Sodium: 141 mmol/L (ref 135–145)
Total Bilirubin: 0.6 mg/dL (ref 0.0–1.2)
Total Protein: 8.2 g/dL — ABNORMAL HIGH (ref 6.5–8.1)

## 2024-04-20 LAB — PROTIME-INR
INR: 1 (ref 0.8–1.2)
Prothrombin Time: 13.2 s (ref 11.4–15.2)

## 2024-04-20 LAB — SAMPLE TO BLOOD BANK

## 2024-04-20 LAB — HCG, SERUM, QUALITATIVE: Preg, Serum: NEGATIVE

## 2024-04-20 LAB — ETHANOL: Alcohol, Ethyl (B): 209 mg/dL — ABNORMAL HIGH (ref <15)

## 2024-04-20 LAB — LACTIC ACID, PLASMA: Lactic Acid, Venous: 1.9 mmol/L (ref 0.5–1.9)

## 2024-04-20 MED ORDER — FENTANYL CITRATE (PF) 100 MCG/2ML IJ SOLN
50.0000 ug | Freq: Once | INTRAMUSCULAR | Status: AC
  Administered 2024-04-20: 50 ug via INTRAVENOUS
  Filled 2024-04-20: qty 2

## 2024-04-20 MED ORDER — LORAZEPAM 1 MG PO TABS
1.0000 mg | ORAL_TABLET | Freq: Once | ORAL | Status: AC
  Administered 2024-04-20: 1 mg via ORAL
  Filled 2024-04-20: qty 1

## 2024-04-20 MED ORDER — OXYCODONE-ACETAMINOPHEN 5-325 MG PO TABS: 1.0000 | ORAL_TABLET | ORAL | 0 refills | Status: DC | PRN

## 2024-04-20 MED ORDER — IOHEXOL 300 MG/ML  SOLN
100.0000 mL | Freq: Once | INTRAMUSCULAR | Status: AC | PRN
  Administered 2024-04-20: 100 mL via INTRAVENOUS

## 2024-04-20 MED ORDER — SODIUM CHLORIDE 0.9 % IV BOLUS
1000.0000 mL | Freq: Once | INTRAVENOUS | Status: AC
  Administered 2024-04-20: 1000 mL via INTRAVENOUS

## 2024-04-20 MED ORDER — BACITRACIN ZINC 500 UNIT/GM EX OINT
TOPICAL_OINTMENT | Freq: Two times a day (BID) | CUTANEOUS | Status: DC
  Filled 2024-04-20: qty 0.9

## 2024-04-20 MED ORDER — LIDOCAINE-EPINEPHRINE (PF) 2 %-1:200000 IJ SOLN
10.0000 mL | Freq: Once | INTRAMUSCULAR | Status: AC
  Administered 2024-04-20: 10 mL via INTRADERMAL
  Filled 2024-04-20: qty 20
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
## 2024-04-20 NOTE — ED Notes (Signed)
 X-ray at bedside.

## 2024-04-20 NOTE — ED Notes (Addendum)
 Xray called to bedside x2

## 2024-04-20 NOTE — ED Provider Notes (Signed)
 I was asked to evaluate patient as the puncture wound to her right trapezius is bleeding.  There appears to be a puncture wound/laceration.  Due to the bleeding, it was repaired with sutures as below.  She also developed transient hypotension into the 70s.  I suspect this is related to intoxication as well as multiple different meds being given.  Her workup has been reviewed and there is no signs of any significant trauma, has a normal hemoglobin, etc.  With a bolus of fluids her blood pressure has improved and now she has had multiple measurements above 100.  Appears stable for discharge as per prior plan.  Will have her follow-up with PCP in 7-10 days for suture removal.  .Laceration Repair  Date/Time: 04/20/2024 7:46 AM  Performed by: Freddi Hamilton, MD Authorized by: Freddi Hamilton, MD   Consent:    Consent obtained:  Verbal   Consent given by:  Patient Universal protocol:    Patient identity confirmed:  Verbally with patient Anesthesia:    Anesthesia method:  Local infiltration   Local anesthetic:  Lidocaine  2% WITH epi Laceration details:    Location:  Shoulder/arm   Shoulder/arm location:  R shoulder   Length (cm):  3 Exploration:    Limited defect created (wound extended): no     Hemostasis achieved with:  Direct pressure   Contaminated: no   Treatment:    Amount of cleaning:  Standard   Irrigation solution:  Sterile water    Irrigation method:  Syringe Skin repair:    Repair method:  Sutures   Suture size:  4-0   Suture material:  Prolene   Number of sutures:  3 Approximation:    Approximation:  Close Repair type:    Repair type:  Simple Post-procedure details:    Procedure completion:  Tolerated well, no immediate complications     Freddi Hamilton, MD 04/20/24 [phone]

## 2024-04-20 NOTE — ED Notes (Signed)
 C-Collar put in place upon arrival.

## 2024-04-20 NOTE — Discharge Instructions (Addendum)
 Keep the abrasions clean and covered.  You do not need to do any specific cleaning other than soap and water  when you bathe.  Make sure you put a covering of bacitracin or other antibiotic ointment on before dressing so they do not stick.  Schedule follow-up with your doctor in a few days for recheck of the wounds.  You will need the sutures removed in about 7-10 days.
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
## 2024-04-20 NOTE — ED Notes (Signed)
 Patient transported to CT
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
## 2024-04-30 NOTE — Telephone Encounter (Signed)
 Called patent left voicemail scheduled with Dr Tobie on Tuesday, 11/18 @ 9:40 and sent a mychart message as well.

## 2024-04-30 NOTE — Telephone Encounter (Unsigned)
 Copied from CRM [id number]. Topic: Appointments - Scheduling Inquiry for Clinic >> Apr 30, 2024  2:25 PM Tiffini S wrote: Reason for CRM: Patient called to schedule a hospital follow appointment- was seen in the  ED on 04/20/24- next available appointment is 06/09/24.   Please call the patient on her grandmother phone Tannie Guisinger [phone] to schedule a hospital follow up within 14 days.

## 2024-05-04 ENCOUNTER — Other Ambulatory Visit (HOSPITAL_COMMUNITY): Payer: Self-pay | Admitting: Registered Nurse

## 2024-05-04 ENCOUNTER — Telehealth (HOSPITAL_COMMUNITY): Payer: Self-pay

## 2024-05-04 ENCOUNTER — Ambulatory Visit (INDEPENDENT_AMBULATORY_CARE_PROVIDER_SITE_OTHER): Admitting: Internal Medicine

## 2024-05-04 ENCOUNTER — Encounter: Payer: Self-pay | Admitting: Internal Medicine

## 2024-05-04 VITALS — BP 116/72 | HR 96 | Ht 69.5 in | Wt 230.0 lb

## 2024-05-04 DIAGNOSIS — T07XXXD Unspecified multiple injuries, subsequent encounter: Secondary | ICD-10-CM

## 2024-05-04 DIAGNOSIS — T07XXXA Unspecified multiple injuries, initial encounter: Secondary | ICD-10-CM | POA: Insufficient documentation

## 2024-05-04 DIAGNOSIS — F50819 Binge eating disorder, unspecified: Secondary | ICD-10-CM

## 2024-05-04 DIAGNOSIS — F902 Attention-deficit hyperactivity disorder, combined type: Secondary | ICD-10-CM

## 2024-05-04 DIAGNOSIS — Z09 Encounter for follow-up examination after completed treatment for conditions other than malignant neoplasm: Secondary | ICD-10-CM | POA: Insufficient documentation

## 2024-05-04 DIAGNOSIS — Z4802 Encounter for removal of sutures: Secondary | ICD-10-CM | POA: Diagnosis not present

## 2024-05-04 DIAGNOSIS — S43101D Unspecified dislocation of right acromioclavicular joint, subsequent encounter: Secondary | ICD-10-CM

## 2024-05-04 DIAGNOSIS — S43101A Unspecified dislocation of right acromioclavicular joint, initial encounter: Secondary | ICD-10-CM | POA: Insufficient documentation

## 2024-05-04 MED ORDER — MUPIROCIN 2 % EX OINT: 1.0000 | TOPICAL_OINTMENT | Freq: Two times a day (BID) | CUTANEOUS | 0 refills | Status: AC

## 2024-05-04 MED ORDER — IBUPROFEN 800 MG PO TABS: 800.0000 mg | ORAL_TABLET | Freq: Three times a day (TID) | ORAL | 1 refills | Status: AC | PRN

## 2024-05-04 MED ORDER — CYCLOBENZAPRINE HCL 5 MG PO TABS: 5.0000 mg | ORAL_TABLET | Freq: Two times a day (BID) | ORAL | 0 refills | Status: DC | PRN

## 2024-05-04 MED ORDER — LISDEXAMFETAMINE DIMESYLATE 50 MG PO CAPS: 50.0000 mg | ORAL_CAPSULE | Freq: Every day | ORAL | 0 refills | Status: DC
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
## 2024-05-04 NOTE — Patient Instructions (Signed)
 Please take Ibuprofen  as needed for pain.  Please take Flexeril  as needed for muscle spasms.  Please apply Mupirocin cream over the right shoulder wound and keep it covered. Please change the dressing daily. Okay to wash with warm water  and soap.

## 2024-05-04 NOTE — Assessment & Plan Note (Addendum)
 Abrasions over right shoulder, back and bilateral feet - healing well Advised to keep area clean and dry Sutures removed from right shoulder laceration Mupirocin ointment for local antibacterial PPx for right shoulder laceration Last Tdap vaccine in 05/24
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
## 2024-05-04 NOTE — Telephone Encounter (Signed)
 [facility] request rx refil on pt's Vyvanse  50 mg capsule. Called pt to schedule f/u no answer left vm. Please advise.

## 2024-05-04 NOTE — Assessment & Plan Note (Addendum)
 ER chart reviewed, including imaging Has multiple abrasions/bruising, healing well Ibuprofen  as needed for pain Advised to keep area clean and dry

## 2024-05-06 ENCOUNTER — Encounter (HOSPITAL_COMMUNITY): Payer: Self-pay | Admitting: Registered Nurse

## 2024-05-06 ENCOUNTER — Telehealth (INDEPENDENT_AMBULATORY_CARE_PROVIDER_SITE_OTHER): Admitting: Registered Nurse

## 2024-05-06 DIAGNOSIS — G4700 Insomnia, unspecified: Secondary | ICD-10-CM | POA: Diagnosis not present

## 2024-05-06 DIAGNOSIS — F411 Generalized anxiety disorder: Secondary | ICD-10-CM | POA: Diagnosis not present

## 2024-05-06 DIAGNOSIS — F410 Panic disorder [episodic paroxysmal anxiety] without agoraphobia: Secondary | ICD-10-CM | POA: Diagnosis not present

## 2024-05-06 DIAGNOSIS — F902 Attention-deficit hyperactivity disorder, combined type: Secondary | ICD-10-CM | POA: Diagnosis not present

## 2024-05-06 DIAGNOSIS — F50819 Binge eating disorder, unspecified: Secondary | ICD-10-CM | POA: Diagnosis not present

## 2024-05-06 MED ORDER — LISDEXAMFETAMINE DIMESYLATE 50 MG PO CAPS: 50.0000 mg | ORAL_CAPSULE | Freq: Every day | ORAL | 0 refills | Status: DC

## 2024-05-06 MED ORDER — FLUOXETINE HCL 20 MG PO CAPS: 20.0000 mg | ORAL_CAPSULE | Freq: Every day | ORAL | 1 refills | Status: DC

## 2024-05-06 MED ORDER — HYDROXYZINE HCL 10 MG PO TABS: ORAL_TABLET | ORAL | 1 refills | Status: DC
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
## 2024-05-06 NOTE — Patient Instructions (Signed)

## 2024-05-11 ENCOUNTER — Encounter: Payer: Self-pay | Admitting: Gastroenterology

## 2024-05-19 ENCOUNTER — Encounter: Payer: Self-pay | Admitting: Orthopedic Surgery

## 2024-05-19 ENCOUNTER — Ambulatory Visit: Admitting: Orthopedic Surgery

## 2024-05-19 VITALS — BP 131/83 | HR 112 | Ht 69.5 in | Wt 232.0 lb

## 2024-05-19 DIAGNOSIS — S43101A Unspecified dislocation of right acromioclavicular joint, initial encounter: Secondary | ICD-10-CM | POA: Diagnosis not present
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
## 2024-06-09 ENCOUNTER — Inpatient Hospital Stay: Payer: Self-pay | Admitting: Internal Medicine

## 2024-06-14 ENCOUNTER — Ambulatory Visit (INDEPENDENT_AMBULATORY_CARE_PROVIDER_SITE_OTHER): Admitting: Clinical

## 2024-06-14 DIAGNOSIS — F329 Major depressive disorder, single episode, unspecified: Secondary | ICD-10-CM

## 2024-06-14 DIAGNOSIS — F902 Attention-deficit hyperactivity disorder, combined type: Secondary | ICD-10-CM

## 2024-06-14 DIAGNOSIS — F410 Panic disorder [episodic paroxysmal anxiety] without agoraphobia: Secondary | ICD-10-CM

## 2024-06-14 DIAGNOSIS — F4310 Post-traumatic stress disorder, unspecified: Secondary | ICD-10-CM | POA: Diagnosis not present

## 2024-06-14 DIAGNOSIS — F411 Generalized anxiety disorder: Secondary | ICD-10-CM

## 2024-06-14 DIAGNOSIS — F331 Major depressive disorder, recurrent, moderate: Secondary | ICD-10-CM

## 2024-06-14 DIAGNOSIS — F909 Attention-deficit hyperactivity disorder, unspecified type: Secondary | ICD-10-CM
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
## 2024-06-16 ENCOUNTER — Ambulatory Visit: Admitting: Orthopedic Surgery

## 2024-07-05 ENCOUNTER — Encounter (HOSPITAL_COMMUNITY): Payer: Self-pay | Admitting: Registered Nurse

## 2024-07-05 ENCOUNTER — Telehealth (HOSPITAL_COMMUNITY): Admitting: Registered Nurse

## 2024-07-05 DIAGNOSIS — G4700 Insomnia, unspecified: Secondary | ICD-10-CM

## 2024-07-05 DIAGNOSIS — F411 Generalized anxiety disorder: Secondary | ICD-10-CM | POA: Diagnosis not present

## 2024-07-05 DIAGNOSIS — F50819 Binge eating disorder, unspecified: Secondary | ICD-10-CM | POA: Diagnosis not present

## 2024-07-05 DIAGNOSIS — F902 Attention-deficit hyperactivity disorder, combined type: Secondary | ICD-10-CM | POA: Diagnosis not present

## 2024-07-05 DIAGNOSIS — F410 Panic disorder [episodic paroxysmal anxiety] without agoraphobia: Secondary | ICD-10-CM | POA: Diagnosis not present

## 2024-07-05 MED ORDER — FLUOXETINE HCL 20 MG PO CAPS: 20.0000 mg | ORAL_CAPSULE | Freq: Every day | ORAL | 1 refills | Status: AC

## 2024-07-05 MED ORDER — HYDROXYZINE HCL 10 MG PO TABS: ORAL_TABLET | ORAL | 1 refills | Status: AC

## 2024-07-05 MED ORDER — LISDEXAMFETAMINE DIMESYLATE 50 MG PO CAPS: 50.0000 mg | ORAL_CAPSULE | Freq: Every day | ORAL | 0 refills | Status: AC

## 2024-07-05 NOTE — Progress Notes (Signed)
 [facility] MD/PA/NP OP Progress Note  07/05/2024 11:51 AM Patty Gomez  MRN:  [id number]  Virtual Visit via Video Note  I connected with Patty Gomez on 07/05/24 at 11:30 AM EST by a video enabled telemedicine application and verified that I am speaking with the correct person using two identifiers.  Location: Patient: Home Provider: Home office   I discussed the limitations of evaluation and management by telemedicine and the availability of in person appointments. The patient expressed understanding and agreed to proceed.    I discussed the assessment and treatment plan with the patient. The patient was provided an opportunity to ask questions and all were answered. The patient agreed with the plan and demonstrated an understanding of the instructions.   The patient was advised to call back or seek an in-person evaluation if the symptoms worsen or if the condition fails to improve as anticipated.  I provided 20 minutes of non-face-to-face time during this encounter.  I personally spent a total of 20 minutes in the care of the patient today including preparing to see the patient, getting/reviewing separately obtained history, performing a medically appropriate exam/evaluation, counseling and educating, placing orders, and documenting clinical information in the EHR in addiction to conducting screenings PHQ-9, C-SSRS, GAD-7, AIMS, Nutrition, and Pain, discussing medication options, medication education, and discussing safety.   Patty Ruder, NP   Chief Complaint:  Chief Complaint  Patient presents with   Follow-up    Medication management   HPI: Patty Gomez 24 y.o. female presents today for medication management follow up.  She is seen via virtual video visit by this provider, and chart reviewed on 07/05/24.  Her psychiatric history is significant for PTSD, ADHD, major depressive disorder, generalized anxiety disorder with panic attacks, and binge eating.  Her mental health is currently  managed with Prozac  20 mg daily, Vyvanse  50 mg daily, and Vistaril  25 mg three times daily as needed.  She reports current medications are effectively managing her mental health without any adverse reactions.  She reports things have been going well.  She denies suicidal/self-harm/homicidal ideation, psychosis, paranoia, and abnormal movement.  Screenings completed during today's visit PHQ-9, C-SSRS, GAD-7, AIMS, Nutrition, and Pain, see scores below.  Recommended the following: Continue Vistaril  10 mg -20 mg times daily as needed, Vyvanse  to 50 mg daily, and Prozac  20 mg daily.   She voices understanding with information being given to her today and is agreeable to recommendations.    Visit Diagnosis:    ICD-10-CM   1. Attention deficit hyperactivity disorder (ADHD), combined type  F90.2 lisdexamfetamine  (VYVANSE ) 50 MG capsule    lisdexamfetamine  (VYVANSE ) 50 MG capsule    lisdexamfetamine  (VYVANSE ) 50 MG capsule    2. Binge eating disorder, unspecified severity  F50.819 lisdexamfetamine  (VYVANSE ) 50 MG capsule    lisdexamfetamine  (VYVANSE ) 50 MG capsule    lisdexamfetamine  (VYVANSE ) 50 MG capsule    FLUoxetine  (PROZAC ) 20 MG capsule    3. Generalized anxiety disorder with panic attacks  F41.1 FLUoxetine  (PROZAC ) 20 MG capsule   F41.0 hydrOXYzine  (ATARAX ) 10 MG tablet    4. Insomnia, unspecified type  G47.00 hydrOXYzine  (ATARAX ) 10 MG tablet     Past Psychiatric History: PTSD, ADHD, major depressive disorder, generalized anxiety disorder with panic attacks, and binge eating   Past Medical History:  Past Medical History:  Diagnosis Date   Acute bronchitis 01/20/2013   Anxiety state 08/22/2015   Depression    Encounter for IUD insertion 01/27/2018   Hair loss 03/11/2022  Iron deficiency 05/15/2022   Migraines    Pain and swelling of toe of left foot 02/11/2022   Preventative health care 02/11/2022   Screening examination for STD (sexually transmitted disease) 03/20/2021    Trichimoniasis    Vaginal burning 03/20/2021   Vaginal discharge 03/20/2021   Vaginal irritation 06/04/2021    Past Surgical History:  Procedure Laterality Date   BIOPSY  05/07/2019   Procedure: BIOPSY;  Surgeon: Harvey Margo CROME, MD;  [facility];  Service: Endoscopy;;   BIOPSY  08/23/2022   Procedure: BIOPSY;  Surgeon: Cindie Carlin POUR, DO;  [facility];  Service: Endoscopy;;   COLONOSCOPY     ESOPHAGOGASTRODUODENOSCOPY N/A 05/07/2019   Procedure: ESOPHAGOGASTRODUODENOSCOPY (EGD);  Surgeon: Harvey Margo CROME, MD;  [facility];  Service: Endoscopy;  Laterality: N/A;  3:00pm   ESOPHAGOGASTRODUODENOSCOPY (EGD) WITH PROPOFOL  N/A 08/23/2022   Procedure: ESOPHAGOGASTRODUODENOSCOPY (EGD) WITH PROPOFOL ;  Surgeon: Cindie Carlin POUR, DO;  [facility];  Service: Endoscopy;  Laterality: N/A;  1:15 pm ,asa 2   TONSILLECTOMY      Family Psychiatric History: See below in family history  Family History:  Family History  Problem Relation Age of Onset   Bipolar disorder Mother    Cancer Mother    Hypertension Paternal Grandmother    Diabetes Paternal Grandmother    Hyperlipidemia Paternal Grandmother    Heart disease Paternal Grandmother    Colon polyps Paternal Grandmother    Diabetes Other    Cancer Other    Colon cancer Neg Hx     Social History:  Social History   Socioeconomic History   Marital status: Single    Spouse name: Not on file   Number of children: Not on file   Years of education: Not on file   Highest education level: Some college, no degree  Occupational History   Not on file  Tobacco Use   Smoking status: Some Days    Types: E-cigarettes    Last attempt to quit: 05/2022    Years since quitting: 2.1   Smokeless tobacco: Never  Vaping Use   Vaping status: Some Days  Substance and Sexual Activity   Alcohol use: Yes    Comment: occ   Drug use: Not Currently    Types: Marijuana    Comment: tried marijuana a few times  as a teenager; hit vape pen with THC in 2024 by accident   Sexual activity: Yes    Birth control/protection: I.U.D.  Other Topics Concern   Not on file  Social History Narrative   ** Merged History Encounter **       Lives with her grandmother. [facility] in spring 2021.    Social Drivers of Health   Tobacco Use: High Risk (07/05/2024)   Patient History    Smoking Tobacco Use: Some Days    Smokeless Tobacco Use: Never    Passive Exposure: Not on file  Financial Resource Strain: Low Risk (01/04/2024)   Overall Financial Resource Strain (CARDIA)    Difficulty of Paying Living Expenses: Not very hard  Food Insecurity: No Food Insecurity (01/04/2024)   Epic    Worried About Radiation Protection Practitioner of Food in the Last Year: Never true    Ran Out of Food in the Last Year: Never true  Transportation Needs: No Transportation Needs (01/04/2024)   Epic    Lack of Transportation (Medical): No    Lack of Transportation (Non-Medical): No  Physical Activity: Sufficiently Active (01/04/2024)  Exercise Vital Sign    Days of Exercise per Week: 3 days    Minutes of Exercise per Session: 60 min  Stress: Stress Concern Present (01/04/2024)   [employer] of Occupational Health - Occupational Stress Questionnaire    Feeling of Stress: To some extent  Social Connections: Socially Integrated (01/04/2024)   Social Connection and Isolation Panel    Frequency of Communication with Friends and Family: Twice a week    Frequency of Social Gatherings with Friends and Family: Twice a week    Attends Religious Services: 1 to 4 times per year    Active Member of Clubs or Organizations: Yes    Attends Banker Meetings: 1 to 4 times per year    Marital Status: Living with partner  Depression (PHQ2-9): Low Risk (07/05/2024)   Depression (PHQ2-9)    PHQ-2 Score: 0  Recent Concern: Depression (PHQ2-9) - Medium Risk (06/14/2024)   Depression (PHQ2-9)    PHQ-2 Score: 9  Alcohol Screen: Low Risk (01/04/2024)    Alcohol Screen    Last Alcohol Screening Score (AUDIT): 0  Housing: Low Risk (01/04/2024)   Epic    Unable to Pay for Housing in the Last Year: No    Number of Times Moved in the Last Year: 0    Homeless in the Last Year: No  Utilities: Not At Risk (04/28/2023)   AHC Utilities    Threatened with loss of utilities: No  Health Literacy: Adequate Health Literacy (04/28/2023)   B1300 Health Literacy    Frequency of need for help with medical instructions: Never    Allergies:  Allergies  Allergen Reactions   Dust Mite Extract     Metabolic Disorder Labs: Lab Results  Component Value Date   HGBA1C 5.1 09/29/2023   MPG 100 09/29/2023   Lab Results  Component Value Date   PROLACTIN 10.0 09/29/2023   Lab Results  Component Value Date   CHOL 157 09/29/2023   TRIG 41 09/29/2023   HDL 77 09/29/2023   CHOLHDL 2.0 09/29/2023   LDLCALC 68 09/29/2023   LDLCALC 58 10/17/2022   Lab Results  Component Value Date   TSH 0.79 09/29/2023   TSH 0.953 10/17/2022    Current Medications: Current Outpatient Medications  Medication Sig Dispense Refill   amitriptyline  (ELAVIL ) 25 MG tablet Take 1 tablet (25 mg total) by mouth at bedtime. 30 tablet 3   dicyclomine  (BENTYL ) 10 MG capsule Take 1 capsule (10 mg total) by mouth 4 (four) times daily -  before meals and at bedtime. As needed for cramping 120 capsule 3   FLUoxetine  (PROZAC ) 20 MG capsule Take 1 capsule (20 mg total) by mouth daily. 90 capsule 1   hydrOXYzine  (ATARAX ) 10 MG tablet Take 10 - 20 mg (1 to 2 tablets) three times a day as needed for anxiety/sleep 270 tablet 1   ibuprofen  (ADVIL ) 800 MG tablet Take 1 tablet (800 mg total) by mouth every 8 (eight) hours as needed. 30 tablet 1   levonorgestrel  (LILETTA ) 19.5 MCG/DAY IUD IUD 1 each by Intrauterine route once.     [START ON 09/15/2024] lisdexamfetamine  (VYVANSE ) 50 MG capsule Take 1 capsule (50 mg total) by mouth daily. 30 capsule 0   [START ON 08/15/2024] lisdexamfetamine   (VYVANSE ) 50 MG capsule Take 1 capsule (50 mg total) by mouth daily. 30 capsule 0   [START ON 07/18/2024] lisdexamfetamine  (VYVANSE ) 50 MG capsule Take 1 capsule (50 mg total) by mouth daily. 30 capsule 0  mupirocin  ointment (BACTROBAN ) 2 % Apply 1 Application topically 2 (two) times daily. 22 g 0   ondansetron  (ZOFRAN ) 4 MG tablet Take 1 tablet (4 mg total) by mouth every 8 (eight) hours as needed for nausea or vomiting. 60 tablet 2   pantoprazole  (PROTONIX ) 40 MG tablet Take 1 tablet (40 mg total) by mouth 2 (two) times daily. 180 tablet 3   No current facility-administered medications for this visit.     Musculoskeletal: Strength & Muscle Tone: Unable to assess via virtual visit Gait & Station: Unable to assess via virtual visit Patient leans: N/A  Psychiatric Specialty Exam: Review of Systems  Constitutional:        No other complaints voiced  Cardiovascular:  Negative for chest pain.       Referral to PCP for further assessment to rule out any other issues  Psychiatric/Behavioral:  Negative for decreased concentration (Stable), hallucinations, self-injury, sleep disturbance (Reported stable) and suicidal ideas. Agitation: Reported stable mood. Dysphoric mood: Reported stable.Nervous/anxious: Reported stable.   All other systems reviewed and are negative.   There were no vitals taken for this visit.There is no height or weight on file to calculate BMI.  General Appearance: Casual  Eye Contact:  Good  Speech:  Clear and Coherent and Normal Rate  Volume:  Normal  Mood:  Euthymic  Good  Affect:  Congruent  Thought Process:  Coherent, Goal Directed, and Descriptions of Associations: Intact  Orientation:  Full (Time, Place, and Person)  Thought Content: Logical   Suicidal Thoughts:  No  Homicidal Thoughts:  No  Memory:  Immediate;   Good Recent;   Good Remote;   Good  Judgement:  Intact  Insight:  Present  Psychomotor Activity:  Normal  Concentration:  Concentration: Good  and Attention Span: Good  Recall:  Good  Fund of Knowledge: Good  Language: Good  Akathisia:  No  Handed:  Right  AIMS (if indicated): done  Assets:  Communication Skills Desire for Improvement Housing Leisure Time Physical Health Resilience Social Support Transportation  ADL's:  Intact  Cognition: WNL  Sleep:  Good   Screenings: AIMS    Flowsheet Row Video Visit from 07/05/2024 in [facility] at [place] Video Visit from 05/06/2024 in [facility] at [place] Video Visit from 04/08/2024 in [facility] at [place] Video Visit from 12/04/2023 in [facility] at [place]  AIMS Total Score 0 0 0 0   GAD-7    Flowsheet Row Video Visit from 07/05/2024 in [facility] at [place] Counselor from 06/14/2024 in [facility] at [place] Video Visit from 05/06/2024 in [facility] at [place] Office Visit from 05/04/2024 in [facility] Video Visit from 04/08/2024 in [facility] at [place]  Total GAD-7 Score 2 19 10 9 3    PHQ2-9    Flowsheet Row Video Visit from 07/05/2024 in [facility] at [place] Counselor from 06/14/2024 in [facility] at [place] Video Visit from 05/06/2024 in [facility] at [place] Office Visit from 05/04/2024 in [facility] Video Visit from 04/08/2024 in [facility] at [facility] Total Score 0 4 2 2 1   PHQ-9 Total Score 0 9 4 4  --   Flowsheet Row Video Visit from 07/05/2024 in [facility] at [facility]  from 06/14/2024 in [facility]patient Behavior[facility] at [place] Video Visit from 05/06/2024 in [facility]  Outpatient Behavioral Health at [place]  C-SSRS RISK CATEGORY No Risk Error: Question 6 not populated No Risk   Assessment and Plan: Assessment: Summary: Today Patty Gomez reported current medication regimen is effectively managing her mental health without any adverse reactions.  Reported eating and sleeping without difficulty.  Denies suicidal/self-harm/homicidal ideation, psychosis, paranoia, and abnormal movement.  Medication assessment completed, refills given.   During visit Patty Gomez was dressed appropriately for age and current weather.  She was seated comfortably in view of camera with no noted distress.  She was alert, oriented x 4, calm, cooperative, and attentive.  Her mood was congruent with affect.  She had normal speech and behavior.  Objectively there was no evidence of psychosis, mania, or delusional thinking.  She  was able to converse coherently and responded appropriately with goal directed thoughts, no distractibility, or pre-occupation.  1. Attention deficit hyperactivity disorder (ADHD), combined type (Primary) - lisdexamfetamine  (VYVANSE ) 50 MG capsule; Take 1 capsule (50 mg total) by mouth daily.  Dispense: 30 capsule; Refill: 0 - lisdexamfetamine  (VYVANSE ) 50 MG capsule; Take 1 capsule (50 mg total) by mouth daily.  Dispense: 30 capsule; Refill: 0 - lisdexamfetamine  (VYVANSE ) 50 MG capsule; Take 1 capsule (50 mg total) by mouth daily.  Dispense: 30 capsule; Refill: 0  2. Binge eating disorder, unspecified severity - lisdexamfetamine  (VYVANSE ) 50 MG capsule; Take 1 capsule (50 mg total) by mouth daily.  Dispense: 30 capsule; Refill: 0 - lisdexamfetamine  (VYVANSE ) 50 MG capsule; Take 1 capsule (50 mg total) by mouth daily.  Dispense: 30 capsule; Refill: 0 - lisdexamfetamine  (VYVANSE ) 50 MG capsule; Take 1 capsule (50 mg total) by mouth daily.  Dispense: 30 capsule; Refill: 0 - FLUoxetine  (PROZAC ) 20 MG capsule; Take 1 capsule (20 mg total) by mouth daily.  Dispense:  90 capsule; Refill: 1  3. Generalized anxiety disorder with panic attacks - FLUoxetine  (PROZAC ) 20 MG capsule; Take 1 capsule (20 mg total) by mouth daily.  Dispense: 90 capsule; Refill: 1 - hydrOXYzine  (ATARAX ) 10 MG tablet; Take 10 - 20 mg (1 to 2 tablets) three times a day as needed for anxiety/sleep  Dispense: 270 tablet; Refill: 1  4. Insomnia, unspecified type - hydrOXYzine  (ATARAX ) 10 MG tablet; Take 10 - 20 mg (1 to 2 tablets) three times a day as needed for anxiety/sleep  Dispense: 270 tablet; Refill: 1   Plan: Medications: Meds ordered this encounter  Medications   lisdexamfetamine  (VYVANSE ) 50 MG capsule    Sig: Take 1 capsule (50 mg total) by mouth daily.    Dispense:  30 capsule    Refill:  0    Supervising Provider:   ARFEEN, SYED T [[id number]]   lisdexamfetamine  (VYVANSE ) 50 MG capsule    Sig: Take 1 capsule (50 mg total) by mouth daily.    Dispense:  30 capsule    Refill:  0    Supervising Provider:   ARFEEN, SYED T [[id number]]   lisdexamfetamine  (VYVANSE ) 50 MG capsule    Sig: Take 1 capsule (50 mg total) by mouth daily.    Dispense:  30 capsule    Refill:  0    Supervising Provider:   ARFEEN, SYED T [[id number]]   FLUoxetine  (PROZAC ) 20 MG capsule    Sig: Take 1 capsule (20 mg total) by mouth daily.    Dispense:  90 capsule    Refill:  1  Supervising Provider:   CURRY PATERSON T [[id number]]   hydrOXYzine  (ATARAX ) 10 MG tablet    Sig: Take 10 - 20 mg (1 to 2 tablets) three times a day as needed for anxiety/sleep    Dispense:  270 tablet    Refill:  1    Supervising Provider:   CURRY PATERSON DASEN [[id number]]   Labs: Not indicated at this time  Other:  Counseling/therapy: Continue services with Patty Pepper, LCSW.   Patty Gomez is instructed to call 911, [phone], mobile crisis, or present to the nearest emergency room should she experience any suicidal/homicidal ideation, auditory/visual/hallucinations, or detrimental worsening of her mental health condition.  Patty Gomez  participated  in the development of this treatment plan and verbalized her understanding/agreement with plan as listed.  Follow Up: Return in 3 month for medication management Call in the interim for any side-effects, decompensation, questions, or problems  Collaboration of Care: Collaboration of Care: Medication Management AEB medication assessment and refills  Patient/Guardian was advised Release of Information must be obtained prior to any record release in order to collaborate their care with an outside provider. Patient/Guardian was advised if they have not already done so to contact the registration department to sign all necessary forms in order for us  to release information regarding their care.   Consent: Patient/Guardian gives verbal consent for treatment and assignment of benefits for services provided during this visit. Patient/Guardian expressed understanding and agreed to proceed.    Patty Burlingame, NP 07/05/2024, 11:51 AM

## 2024-07-05 NOTE — Patient Instructions (Signed)

## 2024-07-12 ENCOUNTER — Ambulatory Visit (HOSPITAL_COMMUNITY): Admitting: Clinical

## 2024-07-12 DIAGNOSIS — F4310 Post-traumatic stress disorder, unspecified: Secondary | ICD-10-CM | POA: Diagnosis not present

## 2024-07-12 DIAGNOSIS — F902 Attention-deficit hyperactivity disorder, combined type: Secondary | ICD-10-CM

## 2024-07-12 DIAGNOSIS — F411 Generalized anxiety disorder: Secondary | ICD-10-CM

## 2024-07-12 DIAGNOSIS — F410 Panic disorder [episodic paroxysmal anxiety] without agoraphobia: Secondary | ICD-10-CM

## 2024-07-12 DIAGNOSIS — F32A Depression, unspecified: Secondary | ICD-10-CM

## 2024-07-12 DIAGNOSIS — F331 Major depressive disorder, recurrent, moderate: Secondary | ICD-10-CM

## 2024-07-12 NOTE — Progress Notes (Signed)
 Virtual Visit via Telephone Note  I connected with Patty Gomez on 07/12/24 at  1:00 PM EST by telephone and verified that I am speaking with the correct person using two identifiers.  Location: Patient: home Provider: office   I discussed the limitations, risks, security and privacy concerns of performing an evaluation and management service by telephone and the availability of in person appointments. I also discussed with the patient that there may be a patient responsible charge related to this service. The patient expressed understanding and agreed to proceed.     THERAPY PROGRESS NOTE   Session Time: 1:00 PM- 1:30 PM   Participation Level: Active   Behavioral Response: CasualAlert/Hyperverbal   Type of Therapy: Individual Therapy   Treatment Goals addressed: Coping   Interventions: CBT, DBT, Solution Focused, Strength-based and Supportive   Summary: Patty Gomez is a 24y.o. female who presents with Depression, Anxiety, PTSD, ADHD . The OPT therapist worked with the patient for her OPT treatment session. The OPT therapist utilized Motivational Interviewing to assist in creating therapeutic repore. The patient in the session was engaged and work in collaboration giving feedback about her triggers and symptoms over the past few weeks through January. The patient spoke about her improvement with self care and taking time for herself. The OPT therapist worked with the patient providing support, encouragement, empowerment as she continues to work herself back out of the legal system, through family loss truama, and get to consistency in finding a healthy baseline. The patient spoke about being active and doing well with cleaning at home and keeping her space clean in her grandparents home. The patient spoke about impact of her grandfather having a stroke and her grandmother currently being in the hospital due to breathing related health problems and the patient anticipates her return home  later this week, but noted she will be on oxygen. The patient spoke about cleaning and getting prepared for her grandmother returning home.   Suicidal/Homicidal: Nowithout intent/plan   Therapist Response:The OPT therapist worked with the patient for the patients scheduled session. The patient was engaged in her session and gave feedback in relation to triggers, symptoms, and behavior responses over the past few weeks. The patient spoke about finishing her requirement to get out of the legal system and being elated to not have to spend weekends in [place].. The OPT therapist worked with the patient utilizing an in session Cognitive Behavioral Therapy exercise. The patient was responsive in the session and verbalized,  Spring will be nice I can't wait to be able to get back outside more and [place]. The OPT therapist worked with the patient overviewing basic health areas including sleep cycle, eating habits, hygiene, and physical exercise. The patient spoke about her work with psychiatrist [name].The patient spoke about her daily affirmations and this helping her to have more positive outlook. The patient spoke about being excited for the Spring season and this motivating her to clean more and when things are clean this boosts her mood. The OPT therapist worked with the patient promoting her compliance with med management. The OPT therapist worked with the patient overviewing upcoming appointments as listed in her MyChart including [facility] on 08/04/2024.The OPT therapist will continue treatment work with the patient in her next scheduled session.   Plan: Return again in 2/3 weeks.   Diagnosis:      Axis I:Depression/ GAD/PTSD/ADHD  Axis II: No diagnosis       Collaboration of Care:  Overview of the patient involvement in the Med Management   Patient/Guardian was advised Release of Information must be obtained prior to any record release in order to collaborate their care  with an outside provider. Patient/Guardian was advised if they have not already done so to contact the registration department to sign all necessary forms in order for us  to release information regarding their care.    Consent: Patient/Guardian gives verbal consent for treatment and assignment of benefits for services provided during this visit. Patient/Guardian expressed understanding and agreed to proceed.    I discussed the assessment and treatment plan with the patient. The patient was provided an opportunity to ask questions and all were answered. The patient agreed with the plan and demonstrated an understanding of the instructions.   The patient was advised to call back or seek an in-person evaluation if the symptoms worsen or if the condition fails to improve as anticipated.   I provided 30 minutes of non-face-to-face time during this encounter.   Jerel Pepper, LCSW   07/12/2024

## 2024-08-04 ENCOUNTER — Ambulatory Visit: Admitting: Obstetrics and Gynecology

## 2024-08-09 ENCOUNTER — Ambulatory Visit (HOSPITAL_COMMUNITY): Admitting: Clinical
